# Patient Record
Sex: Female | Born: 1955
Health system: Southern US, Community
[De-identification: ages and names within clinical notes are randomized; demographics above are authoritative.]

## PROBLEM LIST (undated history)

## (undated) DIAGNOSIS — D649 Anemia, unspecified: Secondary | ICD-10-CM

## (undated) DIAGNOSIS — M1990 Unspecified osteoarthritis, unspecified site: Secondary | ICD-10-CM

## (undated) DIAGNOSIS — I10 Essential (primary) hypertension: Secondary | ICD-10-CM

## (undated) DIAGNOSIS — E119 Type 2 diabetes mellitus without complications: Secondary | ICD-10-CM

## (undated) DIAGNOSIS — T7840XA Allergy, unspecified, initial encounter: Secondary | ICD-10-CM

## (undated) DIAGNOSIS — H269 Unspecified cataract: Secondary | ICD-10-CM

## (undated) DIAGNOSIS — G709 Myoneural disorder, unspecified: Secondary | ICD-10-CM

## (undated) DIAGNOSIS — G4730 Sleep apnea, unspecified: Secondary | ICD-10-CM

## (undated) DIAGNOSIS — K219 Gastro-esophageal reflux disease without esophagitis: Secondary | ICD-10-CM

## (undated) DIAGNOSIS — G35 Multiple sclerosis: Secondary | ICD-10-CM

## (undated) HISTORY — PX: COLONOSCOPY: SHX174

## (undated) HISTORY — DX: Gastro-esophageal reflux disease without esophagitis: K21.9

## (undated) HISTORY — PX: CATARACT EXTRACTION, BILATERAL: SHX1313

## (undated) HISTORY — DX: Type 2 diabetes mellitus without complications: E11.9

## (undated) HISTORY — DX: Sleep apnea, unspecified: G47.30

## (undated) HISTORY — PX: OTHER SURGICAL HISTORY: SHX169

## (undated) HISTORY — DX: Allergy, unspecified, initial encounter: T78.40XA

## (undated) HISTORY — DX: Unspecified osteoarthritis, unspecified site: M19.90

## (undated) HISTORY — DX: Unspecified cataract: H26.9

## (undated) HISTORY — DX: Anemia, unspecified: D64.9

## (undated) HISTORY — DX: Myoneural disorder, unspecified: G70.9

---

## 1998-04-14 ENCOUNTER — Emergency Department (HOSPITAL_COMMUNITY): Admission: EM | Admit: 1998-04-14 | Discharge: 1998-04-14 | Payer: Self-pay | Admitting: Emergency Medicine

## 1999-07-02 ENCOUNTER — Emergency Department (HOSPITAL_COMMUNITY): Admission: EM | Admit: 1999-07-02 | Discharge: 1999-07-02 | Payer: Self-pay | Admitting: Emergency Medicine

## 1999-11-21 ENCOUNTER — Ambulatory Visit (HOSPITAL_COMMUNITY): Admission: RE | Admit: 1999-11-21 | Discharge: 1999-11-21 | Payer: Self-pay | Admitting: Obstetrics & Gynecology

## 1999-11-21 ENCOUNTER — Encounter: Payer: Self-pay | Admitting: Obstetrics & Gynecology

## 2002-06-12 ENCOUNTER — Ambulatory Visit (HOSPITAL_COMMUNITY): Admission: RE | Admit: 2002-06-12 | Discharge: 2002-06-12 | Payer: Self-pay | Admitting: Psychiatry

## 2002-06-12 ENCOUNTER — Encounter: Payer: Self-pay | Admitting: Psychiatry

## 2003-04-29 ENCOUNTER — Other Ambulatory Visit: Admission: RE | Admit: 2003-04-29 | Discharge: 2003-04-29 | Payer: Self-pay | Admitting: Obstetrics and Gynecology

## 2003-11-01 ENCOUNTER — Emergency Department (HOSPITAL_COMMUNITY): Admission: EM | Admit: 2003-11-01 | Discharge: 2003-11-01 | Payer: Self-pay | Admitting: *Deleted

## 2003-12-02 ENCOUNTER — Ambulatory Visit (HOSPITAL_COMMUNITY): Admission: RE | Admit: 2003-12-02 | Discharge: 2003-12-02 | Payer: Self-pay | Admitting: Internal Medicine

## 2003-12-02 ENCOUNTER — Encounter: Payer: Self-pay | Admitting: Cardiology

## 2004-12-23 ENCOUNTER — Emergency Department (HOSPITAL_COMMUNITY): Admission: EM | Admit: 2004-12-23 | Discharge: 2004-12-23 | Payer: Self-pay | Admitting: Emergency Medicine

## 2005-11-05 ENCOUNTER — Emergency Department (HOSPITAL_COMMUNITY): Admission: EM | Admit: 2005-11-05 | Discharge: 2005-11-05 | Payer: Self-pay | Admitting: *Deleted

## 2008-03-13 ENCOUNTER — Emergency Department (HOSPITAL_COMMUNITY): Admission: EM | Admit: 2008-03-13 | Discharge: 2008-03-13 | Payer: Self-pay | Admitting: Emergency Medicine

## 2009-02-14 ENCOUNTER — Emergency Department (HOSPITAL_COMMUNITY): Admission: EM | Admit: 2009-02-14 | Discharge: 2009-02-14 | Payer: Self-pay | Admitting: Emergency Medicine

## 2009-10-01 ENCOUNTER — Encounter: Admission: RE | Admit: 2009-10-01 | Discharge: 2009-10-01 | Payer: Self-pay | Admitting: Psychiatry

## 2009-11-01 ENCOUNTER — Encounter (INDEPENDENT_AMBULATORY_CARE_PROVIDER_SITE_OTHER): Payer: Self-pay

## 2009-11-02 ENCOUNTER — Telehealth: Payer: Self-pay | Admitting: Gastroenterology

## 2009-11-02 ENCOUNTER — Ambulatory Visit: Payer: Self-pay | Admitting: Gastroenterology

## 2009-11-02 ENCOUNTER — Encounter (INDEPENDENT_AMBULATORY_CARE_PROVIDER_SITE_OTHER): Payer: Self-pay | Admitting: *Deleted

## 2009-11-15 ENCOUNTER — Telehealth: Payer: Self-pay | Admitting: Gastroenterology

## 2009-11-16 ENCOUNTER — Ambulatory Visit: Payer: Self-pay | Admitting: Gastroenterology

## 2010-08-12 ENCOUNTER — Emergency Department (HOSPITAL_BASED_OUTPATIENT_CLINIC_OR_DEPARTMENT_OTHER): Admission: EM | Admit: 2010-08-12 | Discharge: 2010-08-12 | Payer: Self-pay | Admitting: Emergency Medicine

## 2010-10-12 NOTE — Procedures (Signed)
Summary: Colonoscopy  Patient: Kathryn Kathryn Gross Kathryn Gross Note: All result statuses are Final unless otherwise noted.  Tests: (1) Colonoscopy (COL)   COL Colonoscopy           DONE     [name]     [address]     [place], [place]  [address]           COLONOSCOPY PROCEDURE REPORT           PATIENT:  Kathryn Kathryn Gross, Kathryn Gross  MR#:  [id number]     BIRTHDATE:  05-20-1956, 53 yrs. old  GENDER:  female           ENDOSCOPIST:  Barbette Hair. Arlyce Dice, MD     Referred by:           PROCEDURE DATE:  11/16/2009     PROCEDURE:  Colonoscopy, Diagnostic     ASA CLASS:  Class II     INDICATIONS:  Routine Risk Screening           MEDICATIONS:   Fentanyl 50 mcg IV, Versed 7 mg IV           DESCRIPTION OF PROCEDURE:   After the risks benefits and     alternatives of the procedure were thoroughly explained, informed     consent was obtained.  Digital rectal exam was performed and     revealed no abnormalities.   The LB [id number] endoscope     was introduced through the anus and advanced to the cecum, which     was identified by both the appendix and ileocecal valve, without     limitations.  The quality of the prep was good, using MoviPrep.     The instrument was then slowly withdrawn as the colon was fully     examined.     <<PROCEDUREIMAGES>>           FINDINGS:  Mild diverticulosis was found in the sigmoid colon (see     image14).  This was otherwise a normal examination of the colon     (see image2, image4, image5, image6, image7, image9, image10,     image12, image13, image18, and image19).   Retroflexed views in     the rectum revealed no abnormalities.    The scope was then     withdrawn from the patient and the procedure completed.           COMPLICATIONS:  None           ENDOSCOPIC IMPRESSION:     1) Mild diverticulosis in the sigmoid colon     2) Otherwise normal examination     RECOMMENDATIONS:     1) Continue current colorectal screening recommendations for     "routine  risk" patients with a repeat colonoscopy in 10 years.           REPEAT EXAM:  In 10 year(s) for Colonoscopy.           ______________________________     Barbette Hair. Arlyce Dice, MD           CC:  Tracey Harries, MD           n.     Rosalie Doctor:   Barbette Hair. Kaplan at 11/16/2009 10:07 AM           Melinda Crutch, [id number]  Note: An exclamation mark (!) indicates a result that was not dispersed into the flowsheet. Document Creation Date: 11/16/2009 10:07 AM _______________________________________________________________________  (1) Order result status: Final Collection  or observation date-time: 11/16/2009 10:00 Requested date-time:  Receipt date-time:  Reported date-time:  Referring Physician:   Ordering Physician: Melvia Heaps [phone]) Specimen Source:  Source: Launa Grill Order Number: [id number] Lab site:   Appended Document: Colonoscopy    Clinical Lists Changes  Observations: Added new observation of COLONNXTDUE: 11/2019 (11/16/2009 13:28)

## 2010-10-12 NOTE — Progress Notes (Signed)
Summary: prep ?'s  Phone Note Call from Patient Call back at Home Phone [phone]   Caller: Patient Call For: Dr. Arlyce Dice Reason for Call: Talk to Nurse Summary of Call: pt has questions regarding her prep... procedure is tomorrow Initial call taken by: Vallarie Mare,  November 15, 2009 10:12 AM    Additional Follow-up for Phone Call Additional follow up Details #2::    pt. wanted to clarify when to start prep solution explained to take two ducolax at 3p.m. and start drinking solution at 6p.m. and take two more ducolax at 8p.m. and informed her that she is to be on clear liquid diet all day, but she can drink coffee without dairy products. Follow-up by: Greer Ee RN,  November 15, 2009 10:22 AM

## 2010-10-12 NOTE — Progress Notes (Signed)
Summary: Ins. will not pay for Moviprep  Phone Note From Pharmacy   Caller: [facility]  [phone] Call For: Dr. Arlyce Dice  Reason for Call: Medication not on formulary Summary of Call: Pt.'s ins. will not pay for the Moviprep. Initial call taken by: Karna Christmas,  November 02, 2009 3:19 PM    New/Updated Medications: DULCOLAX 5 MG  TBEC (BISACODYL) Day before procedure take 2 at 3pm and 2 at 8pm. METOCLOPRAMIDE HCL 10 MG  TABS (METOCLOPRAMIDE HCL) As per prep instructions. MIRALAX   POWD (POLYETHYLENE GLYCOL 3350) As per prep  instructions. Prescriptions: MIRALAX   POWD (POLYETHYLENE GLYCOL 3350) As per prep  instructions.  #255gm x 0   Entered by:   Wyona Almas RN   Authorized by:   Louis Meckel MD   Signed by:   Wyona Almas RN on 11/02/2009   Method used:   Electronically to        [facility] [phone]* (retail)       [address]       [place], [place]  [address]       Ph: [phone]       Fax: [phone]   RxID:   [id number] METOCLOPRAMIDE HCL 10 MG  TABS (METOCLOPRAMIDE HCL) As per prep instructions.  #2 x 0   Entered by:   Wyona Almas RN   Authorized by:   Louis Meckel MD   Signed by:   Wyona Almas RN on 11/02/2009   Method used:   Electronically to        [facility] [phone]* (retail)       [address]       [place], [place]  [address]       Ph: [phone]       Fax: [phone]   RxID:   [id number] DULCOLAX 5 MG  TBEC (BISACODYL) Day before procedure take 2 at 3pm and 2 at 8pm.  #4 x 0   Entered by:   Wyona Almas RN   Authorized by:   Louis Meckel MD   Signed by:   Wyona Almas RN on 11/02/2009   Method used:   Electronically to        [facility] [phone]* (retail)       [address]       [place], [place]  [address]       Ph: [phone]       Fax: [phone]   RxID:   [id number]

## 2010-10-12 NOTE — Letter (Signed)
Summary: [facility] Instructions  [facility]  [address] [place], [place] [address]   Phone: [phone]  Fax: [phone]       Kathryn Gross    02-09-1956    MRN: [id number]        Procedure Day Dorna Bloom: Wednesday 11/16/2009     Arrival Time: 8:00 am      Procedure Time: 9:00 am     Location of Procedure:                    _ x_  [facility] (4th Floor)                        PREPARATION FOR COLONOSCOPY WITH MOVIPREP   Starting 5 days prior to your procedure Friday 3/4  do not eat nuts, seeds, popcorn, corn, beans, peas,  salads, or any raw vegetables.  Do not take any fiber supplements (e.g. Metamucil, Citrucel, and Benefiber).  THE DAY BEFORE YOUR PROCEDURE         DATE: Tuesday 3/8  1.  Drink clear liquids the entire day-NO SOLID FOOD  2.  Do not drink anything colored red or purple.  Avoid juices with pulp.  No orange juice.  3.  Drink at least 64 oz. (8 glasses) of fluid/clear liquids during the day to prevent dehydration and help the prep work efficiently.  CLEAR LIQUIDS INCLUDE: Water Jello Ice Popsicles Tea (sugar ok, no milk/cream) Powdered fruit flavored drinks Coffee (sugar ok, no milk/cream) Gatorade Juice: apple, white grape, white cranberry  Lemonade Clear bullion, consomm, broth Carbonated beverages (any kind) Strained chicken noodle soup Hard Candy                             4.  In the morning, mix first dose of MoviPrep solution:    Empty 1 Pouch A and 1 Pouch B into the disposable container    Add lukewarm drinking water to the top line of the container. Mix to dissolve    Refrigerate (mixed solution should be used within 24 hrs)  5.  Begin drinking the prep at 5:00 p.m. The MoviPrep container is divided by 4 marks.   Every 15 minutes drink the solution down to the next mark (approximately 8 oz) until the full liter is complete.   6.  Follow completed prep with 16 oz of clear liquid of your choice  (Nothing red or purple).  Continue to drink clear liquids until bedtime.  7.  Before going to bed, mix second dose of MoviPrep solution:    Empty 1 Pouch A and 1 Pouch B into the disposable container    Add lukewarm drinking water to the top line of the container. Mix to dissolve    Refrigerate  THE DAY OF YOUR PROCEDURE      DATE: Wednesday 3/9  Beginning at 4:00 am (5 hours before procedure):         1. Every 15 minutes, drink the solution down to the next mark (approx 8 oz) until the full liter is complete.  2. Follow completed prep with 16 oz. of clear liquid of your choice.    3. You may drink clear liquids until 7:00 am  (2 HOURS BEFORE PROCEDURE).   MEDICATION INSTRUCTIONS  Unless otherwise instructed, you should take regular prescription medications with a small sip of water   as early as possible the morning  of your procedure.          OTHER INSTRUCTIONS  You will need a responsible adult at least 53 years of age to accompany you and drive you home.   This person must remain in the waiting room during your procedure.  Wear loose fitting clothing that is easily removed.  Leave jewelry and other valuables at home.  However, you may wish to bring a book to read or  an iPod/MP3 player to listen to music as you wait for your procedure to start.  Remove all body piercing jewelry and leave at home.  Total time from sign-in until discharge is approximately 2-3 hours.  You should go home directly after your procedure and rest.  You can resume normal activities the  day after your procedure.  The day of your procedure you should not:   Drive   Make legal decisions   Operate machinery   Drink alcohol   Return to work  You will receive specific instructions about eating, activities and medications before you leave.    The above instructions have been reviewed and explained to me by   Ulis Rias RN  November 02, 2009 9:11 AM     I fully understand  and can verbalize these instructions _____________________________ Date _________

## 2010-10-12 NOTE — Letter (Signed)
Summary: [facility] Instructions  [facility]  [address] [place], [place] [address]   Phone: [phone]  Fax: [phone]       Kathryn Gross    March 28, 1956    MRN: [id number]       Procedure Day Dorna Bloom:  [facility]  11/16/09     Arrival Time: 8:00AM     Procedure Time:  9:00AM     Location of Procedure:                    _ X_  [facility] (4th Floor)    PREPARATION FOR COLONOSCOPY WITH MIRALAX  Starting 5 days prior to your procedure 11/11/09 do not eat nuts, seeds, popcorn, corn, beans, peas,  salads, or any raw vegetables.  Do not take any fiber supplements (e.g. Metamucil, Citrucel, and Benefiber). ____________________________________________________________________________________________________   THE DAY BEFORE YOUR PROCEDURE         DATE: 11/15/09  DAY: TUESDAY  1   Drink clear liquids the entire day-NO SOLID FOOD  2   Do not drink anything colored red or purple.  Avoid juices with pulp.  No orange juice.  3   Drink at least 64 oz. (8 glasses) of fluid/clear liquids during the day to prevent dehydration and help the prep work efficiently.  CLEAR LIQUIDS INCLUDE: Water Jello Ice Popsicles Tea (sugar ok, no milk/cream) Powdered fruit flavored drinks Coffee (sugar ok, no milk/cream) Gatorade Juice: apple, white grape, white cranberry  Lemonade Clear bullion, consomm, broth Carbonated beverages (any kind) Strained chicken noodle soup Hard Candy  4   Mix the entire bottle of Miralax with 64 oz. of Gatorade/Powerade in the morning and put in the refrigerator to chill.  5   At 3:00 pm take 2 Dulcolax/Bisacodyl tablets.  6   At 4:30 pm take one Reglan/Metoclopramide tablet.  7  Starting at 5:00 pm drink one 8 oz glass of the Miralax mixture every 15-20 minutes until you have finished drinking the entire 64 oz.  You should finish drinking prep around 7:30 or 8:00 pm.  8   If you are nauseated, you may take the 2nd Reglan/Metoclopramide  tablet at 6:30 pm.        9    At 8:00 pm take 2 more DULCOLAX/Bisacodyl tablets.     THE DAY OF YOUR PROCEDURE      DATE:  11/16/09  DAY:  Lulu Riding  You may drink clear liquids until 7:00AM (2 HOURS BEFORE PROCEDURE).   MEDICATION INSTRUCTIONS  Unless otherwise instructed, you should take regular prescription medications with a small sip of water as early as possible the morning of your procedure.         OTHER INSTRUCTIONS  You will need a responsible adult at least 53 years of age to accompany you and drive you home.   This person must remain in the waiting room during your procedure.  Wear loose fitting clothing that is easily removed.  Leave jewelry and other valuables at home.  However, you may wish to bring a book to read or an iPod/MP3 player to listen to music as you wait for your procedure to start.  Remove all body piercing jewelry and leave at home.  Total time from sign-in until discharge is approximately 2-3 hours.  You should go home directly after your procedure and rest.  You can resume normal activities the day after your procedure.  The day of your procedure you should not:   Drive  Make legal decisions   Operate machinery   Drink alcohol   Return to work  You will receive specific instructions about eating, activities and medications before you leave.   The above instructions have been reviewed and explained to me by   Wyona Almas RN  November 02, 2009 3:44 PM     I fully understand and can verbalize these instructions _____________________________ Date _______

## 2010-10-12 NOTE — Miscellaneous (Signed)
Summary: Lec previsit  Clinical Lists Changes  Medications: Added new medication of MOVIPREP 100 GM  SOLR (PEG-KCL-NACL-NASULF-NA ASC-C) As per prep instructions. - Signed Rx of MOVIPREP 100 GM  SOLR (PEG-KCL-NACL-NASULF-NA ASC-C) As per prep instructions.;  #1 x 0;  Signed;  Entered by: Ulis Rias RN;  Authorized by: Louis Meckel MD;  Method used: Electronically to [facility] [phone]*, [address] [place], [place]  [address], Ph: [phone], Fax: [phone] Observations: Added new observation of NKA: T (11/02/2009 8:53)    Prescriptions: MOVIPREP 100 GM  SOLR (PEG-KCL-NACL-NASULF-NA ASC-C) As per prep instructions.  #1 x 0   Entered by:   Ulis Rias RN   Authorized by:   Louis Meckel MD   Signed by:   Ulis Rias RN on 11/02/2009   Method used:   Electronically to        [facility] [phone]* (retail)       [address]       [place], [place]  [address]       Ph: [phone]       Fax: [phone]   RxID:   [id number]

## 2010-10-31 ENCOUNTER — Other Ambulatory Visit: Payer: Self-pay | Admitting: Psychiatry

## 2010-10-31 DIAGNOSIS — G35 Multiple sclerosis: Secondary | ICD-10-CM

## 2010-11-06 ENCOUNTER — Ambulatory Visit
Admission: RE | Admit: 2010-11-06 | Discharge: 2010-11-06 | Disposition: A | Discharge status: Home / Self Care | Payer: Medicare Other | Source: Ambulatory Visit | Attending: Psychiatry | Admitting: Psychiatry

## 2010-11-06 DIAGNOSIS — G35 Multiple sclerosis: Secondary | ICD-10-CM

## 2010-11-06 MED ORDER — GADOBENATE DIMEGLUMINE 529 MG/ML IV SOLN
20.0000 mL | Freq: Once | INTRAVENOUS | Status: AC | PRN
  Administered 2010-11-06: 20 mL via INTRAVENOUS

## 2010-11-21 LAB — RAPID STREP SCREEN (MED CTR MEBANE ONLY): Streptococcus, Group A Screen (Direct): NEGATIVE

## 2010-12-18 LAB — DIFFERENTIAL
Basophils Absolute: 0 10*3/uL (ref 0.0–0.1)
Lymphocytes Relative: 32 % (ref 12–46)
Lymphs Abs: 3.1 10*3/uL (ref 0.7–4.0)
Neutro Abs: 5.8 10*3/uL (ref 1.7–7.7)
Neutrophils Relative %: 60 % (ref 43–77)

## 2010-12-18 LAB — BASIC METABOLIC PANEL
BUN: 12 mg/dL (ref 6–23)
Calcium: 9.2 mg/dL (ref 8.4–10.5)
GFR calc non Af Amer: >60 mL/min (ref >60)
Glucose, Bld: 121 mg/dL — ABNORMAL HIGH (ref 70–99)
Potassium: 3.7 mEq/L (ref 3.5–5.1)

## 2010-12-18 LAB — POCT CARDIAC MARKERS
CKMB, poc: 1.8 ng/mL (ref 1.0–8.0)
Myoglobin, poc: 62.3 ng/mL (ref 12–200)
Troponin i, poc: <0.05 ng/mL (ref 0.00–0.09)

## 2010-12-18 LAB — CBC
HCT: 36.3 % (ref 36.0–46.0)
Platelets: 251 10*3/uL (ref 150–400)
RDW: 14.8 % (ref 11.5–15.5)
WBC: 9.7 10*3/uL (ref 4.0–10.5)

## 2011-02-18 ENCOUNTER — Inpatient Hospital Stay (HOSPITAL_COMMUNITY)
Admission: AD | Admit: 2011-02-18 | Discharge: 2011-02-21 | DRG: 690 | Disposition: A | Discharge status: Home / Self Care | Payer: Medicare Other | Source: Other Acute Inpatient Hospital | Attending: Internal Medicine | Admitting: Internal Medicine

## 2011-02-18 ENCOUNTER — Emergency Department (HOSPITAL_BASED_OUTPATIENT_CLINIC_OR_DEPARTMENT_OTHER)
Admission: EM | Admit: 2011-02-18 | Discharge: 2011-02-18 | Disposition: A | Discharge status: Home / Self Care | Payer: Medicare Other | Source: Home / Self Care | Attending: Emergency Medicine | Admitting: Emergency Medicine

## 2011-02-18 DIAGNOSIS — E876 Hypokalemia: Secondary | ICD-10-CM | POA: Diagnosis present

## 2011-02-18 DIAGNOSIS — I10 Essential (primary) hypertension: Secondary | ICD-10-CM | POA: Diagnosis present

## 2011-02-18 DIAGNOSIS — G35 Multiple sclerosis: Secondary | ICD-10-CM | POA: Diagnosis present

## 2011-02-18 DIAGNOSIS — N10 Acute tubulo-interstitial nephritis: Principal | ICD-10-CM | POA: Diagnosis present

## 2011-02-18 LAB — COMPREHENSIVE METABOLIC PANEL
AST: 12 U/L (ref 0–37)
Albumin: 3.5 g/dL (ref 3.5–5.2)
Alkaline Phosphatase: 69 U/L (ref 39–117)
BUN: 15 mg/dL (ref 6–23)
Chloride: 105 mEq/L (ref 96–112)
Creatinine, Ser: 0.6 mg/dL (ref 0.4–1.2)
Potassium: 2.8 mEq/L — ABNORMAL LOW (ref 3.5–5.1)
Total Protein: 6.5 g/dL (ref 6.0–8.3)

## 2011-02-18 LAB — CBC
HCT: 35.2 % — ABNORMAL LOW (ref 36.0–46.0)
MCHC: 33.5 g/dL (ref 30.0–36.0)
Platelets: 248 10*3/uL (ref 150–400)
RDW: 14.9 % (ref 11.5–15.5)
WBC: 20.9 10*3/uL — ABNORMAL HIGH (ref 4.0–10.5)

## 2011-02-18 LAB — URINE MICROSCOPIC-ADD ON

## 2011-02-18 LAB — URINALYSIS, ROUTINE W REFLEX MICROSCOPIC
Glucose, UA: NEGATIVE mg/dL
Nitrite: NEGATIVE
Specific Gravity, Urine: 1.027 (ref 1.005–1.030)
pH: 6 (ref 5.0–8.0)

## 2011-02-18 LAB — DIFFERENTIAL
Basophils Relative: 0 % (ref 0–1)
Eosinophils Absolute: 0 10*3/uL (ref 0.0–0.7)
Eosinophils Relative: 0 % (ref 0–5)
Lymphs Abs: 6.7 10*3/uL — ABNORMAL HIGH (ref 0.7–4.0)
Neutrophils Relative %: 62 % (ref 43–77)

## 2011-02-18 LAB — PREGNANCY, URINE: Preg Test, Ur: NEGATIVE

## 2011-02-19 ENCOUNTER — Inpatient Hospital Stay (HOSPITAL_COMMUNITY): Payer: Medicare Other

## 2011-02-19 LAB — BASIC METABOLIC PANEL
BUN: 12 mg/dL (ref 6–23)
CO2: 25 mEq/L (ref 19–32)
Calcium: 8.1 mg/dL — ABNORMAL LOW (ref 8.4–10.5)
Creatinine, Ser: 0.57 mg/dL (ref 0.4–1.2)
Glucose, Bld: 164 mg/dL — ABNORMAL HIGH (ref 70–99)

## 2011-02-19 LAB — CBC
HCT: 35.6 % — ABNORMAL LOW (ref 36.0–46.0)
Hemoglobin: 12.3 g/dL (ref 12.0–15.0)
MCH: 30.8 pg (ref 26.0–34.0)
MCV: 89.2 fL (ref 78.0–100.0)
RBC: 3.99 MIL/uL (ref 3.87–5.11)

## 2011-02-20 LAB — CBC
MCH: 29.3 pg (ref 26.0–34.0)
Platelets: 230 10*3/uL (ref 150–400)
RBC: 4.13 MIL/uL (ref 3.87–5.11)

## 2011-02-20 LAB — URINALYSIS, ROUTINE W REFLEX MICROSCOPIC
Ketones, ur: NEGATIVE mg/dL
Leukocytes, UA: NEGATIVE
Nitrite: NEGATIVE
Protein, ur: NEGATIVE mg/dL
Urobilinogen, UA: 0.2 mg/dL (ref 0.0–1.0)
pH: 6.5 (ref 5.0–8.0)

## 2011-02-20 LAB — BASIC METABOLIC PANEL
CO2: 26 mEq/L (ref 19–32)
Calcium: 8.5 mg/dL (ref 8.4–10.5)
GFR calc non Af Amer: >60 mL/min (ref >60)
Sodium: 139 mEq/L (ref 135–145)

## 2011-02-20 LAB — URINE CULTURE: Colony Count: 50000

## 2011-02-20 LAB — MAGNESIUM: Magnesium: 2.2 mg/dL (ref 1.5–2.5)

## 2011-02-21 LAB — BASIC METABOLIC PANEL
BUN: 11 mg/dL (ref 6–23)
Calcium: 8.6 mg/dL (ref 8.4–10.5)
GFR calc non Af Amer: >60 mL/min (ref >60)
Glucose, Bld: 168 mg/dL — ABNORMAL HIGH (ref 70–99)

## 2011-02-21 LAB — CBC
HCT: 39.8 % (ref 36.0–46.0)
Hemoglobin: 13.1 g/dL (ref 12.0–15.0)
MCH: 29.7 pg (ref 26.0–34.0)
MCHC: 32.9 g/dL (ref 30.0–36.0)
MCV: 90.2 fL (ref 78.0–100.0)

## 2011-02-21 LAB — URINE CULTURE

## 2011-03-29 NOTE — Discharge Summary (Signed)
NAMEMarland Kitchen  Kathryn Gross, Kathryn Gross NO.:  [id number]  MEDICAL RECORD NO.:  [id number]  LOCATION:  5523                         FACILITY:  [facility]  PHYSICIAN:  Calvert Cantor, M.D.     DATE OF BIRTH:  1955/12/24  DATE OF ADMISSION:  02/18/2011 DATE OF DISCHARGE:  02/21/2011                              DISCHARGE SUMMARY   PRIMARY CARE PHYSICIAN:  Tracey Harries, MD, who is with [facility].  NEUROLOGIST:  Dr. Harriette Bouillon in [place].  CONDITION AT THE TIME OF DISCHARGE:  The patient is up and chipper requesting discharge to home.  She is still having mild nausea and mild back pain, but appears to be recovering well from her pyelonephritis.  DISCHARGE DIAGNOSES: 1. Acute pyelonephritis. 2. Hypokalemia. 3. Hypertension. 4. History of multiple sclerosis with no acute issues at this point.  HISTORY OF PRESENT ILLNESS AND BRIEF HOSPITAL COURSE:  Kathryn Gross is a 55 year old female with a history of multiple sclerosis and hypertension who [facility] 1 week of feeling weak, with difficulty walking secondary to back pain for approximately 2 weeks.  She stated that back pain was across her lower back and describes urinary frequency.  She had been to see her doctor, a few days earlier and was placed on a prednisone taper, however, this not had been helping with her symptoms.  In the emergency department, she was found to have urinary tract infection and she was admitted on IV antibiotics. 1. Pyelonephritis.  The patient had a renal ultrasound done on February 19, 2011, that showed mildly elevated renal cortical echogenicity,     but did not show any obstruction or mass.  She improved steadily on     IV antibiotics, primarily Cipro 500 mg q.8 hours.  For pain, she     She required Mobic, oxycodone, when the oxycodone was discontinued,     she utilized tramadol.  Her original urinalysis in the  emergency     department showed only small leukocytes with 7-10 white blood     cells.  Urine culture was found to have insignificant growth and     was finalized on February 21, 2011.  The patient will be discharged to     home with 6 more days of Cipro oral antibiotics. 2. Hypokalemia.  On admission, the patient's potassium was 2.8, this     was repleted.  She was found to have a magnesium level of 2.5.     After repletion, her potassium level remained stable today at the     time of discharge, it was 3.6. 3. Hypertension.  The patient's blood pressures in-house were within     reasonable range with a systolic ranging from 110-136 and diastolic     ranging from 67-84.  She was maintained on her lisinopril in-house. 4. Multiple sclerosis.  There were no acute issues with this     diagnosis.  At present, her prednisone taper was discontinued on     admission.  DISCHARGE PHYSICAL EXAMINATION:  GENERAL:  Physical exam at time of discharge, patient is alert and oriented.  She is dressed in her street  clothes and walking about the room without difficulty. VITAL SIGNS:  Temperature 97.9, pulse 63, respirations 20, blood pressure 110/67. HEENT:  Head is atraumatic, normocephalic.  Eyes are anicteric.  Her pupils are equal and round.  Nose shows no nasal discharge or exterior lesions.  Mouth has moist mucous membranes. NECK:  Supple with midline trachea.  No JVD.  No lymphadenopathy. CHEST:  Demonstrates no accessory muscle use.  She has no wheezes or crackles to my exam. HEART:  Regular rate and rhythm without obvious murmurs, rubs, or gallops. ABDOMEN:  Soft, obese, nontender, nondistended with bowel sounds. EXTREMITIES:  Show no clubbing, cyanosis, or edema. PSYCHIATRIC:  Patient is alert and oriented.  Demeanor is pleasant, cooperative.  Grooming is excellent.  DISCHARGE MEDICATIONS: 1. Ciprofloxacin 500 mg 1 tablet twice daily take for 6 days ending     the evening of February 28, 2011.   She received a prescription for 13     tablets. 2. Phenergan 25 mg 1 tablet by mouth every 6 hours as needed for     nausea. 3. Tramadol 50 mg 1 tablet by mouth every 6 hours as needed for pain.     She will be given a prescription for 5 days. 4. Lisinopril 10 mg 1 tablet by mouth daily. 5. Meloxicam 15 mg 1 tablet by mouth daily p.r.n. pain. 6. Methylphenidate 10 mg 1 tablet by mouth daily as needed for MS     flare-ups. 7. Rebif interferon beta- 1a 44 mcg injection one subcutaneously every     other day. 8. Temazepam 15 mg for sleep 1 capsule by mouth daily at bedtime as     needed.  DISCHARGE INSTRUCTIONS:  The patient is to increase activity slowly. She is to remain on a low-sodium heart-healthy diet.  She is to call Dr. Tracey Harries to have a hospital followup appointment within 2 weeks.     Stephani Police, PA   ______________________________ Calvert Cantor, M.D.    MLY/MEDQ  D:  02/21/2011  T:  02/22/2011  Job:  [id number]  cc:   Tracey Harries, M.D. Dr. Harriette Bouillon  Electronically Signed by Algis Downs PA on 03/05/2011 07:43:53 PM Electronically Signed by Calvert Cantor M.D. on 03/29/2011 12:10:05 PM

## 2011-04-02 ENCOUNTER — Ambulatory Visit: Payer: Medicare Other | Admitting: Rehabilitation

## 2011-04-04 ENCOUNTER — Ambulatory Visit: Payer: Medicare Other | Attending: Family Medicine | Admitting: Rehabilitation

## 2011-04-04 DIAGNOSIS — M545 Low back pain, unspecified: Secondary | ICD-10-CM | POA: Insufficient documentation

## 2011-04-04 DIAGNOSIS — M2569 Stiffness of other specified joint, not elsewhere classified: Secondary | ICD-10-CM | POA: Insufficient documentation

## 2011-04-04 DIAGNOSIS — IMO0001 Reserved for inherently not codable concepts without codable children: Secondary | ICD-10-CM | POA: Insufficient documentation

## 2011-04-10 ENCOUNTER — Encounter: Payer: Medicare Other | Admitting: Physical Therapy

## 2011-04-13 ENCOUNTER — Encounter: Payer: Medicare Other | Admitting: Physical Therapy

## 2011-06-26 NOTE — H&P (Signed)
Kathryn Gross, Kathryn Gross NO.:  [id number]  MEDICAL RECORD NO.:  [id number]  LOCATION:  5523                         FACILITY:  [facility]  PHYSICIAN:  Lamar Laundry, MD      DATE OF BIRTH:  [birth year]  DATE OF ADMISSION:  02/18/2011 DATE OF DISCHARGE:                             HISTORY & PHYSICAL   CHIEF COMPLAINT:  Back pain and weakness.  HISTORY OF PRESENT ILLNESS:  Ms. Leventhal is a 55 year old female with a history of multiple sclerosis and hypertension who presented to the [facility] ER today after 1 week of being weak, feeling like it was more difficult for her to walk and back pain for 2 weeks.  She states the back pain was located across her lower back.  She also described some increase in urinary frequency.  She denies any dysuria.  She denies any fevers.  Of note the patient thought that her symptoms may have been secondary to multiple sclerosis, so she spoke with her outpatient doctor and was subsequently placed on a prednisone taper 6 days ago.  Normally when she has been placed on prednisone, this has relieved her symptoms that she did not get better at all.  At the outside ER today, they realized that she had a urinary tract infection and started her on ceftriaxone.  She also received Zofran.  ALLERGIES:  No known drug allergies.  MEDICATIONS: 1. Lisinopril 10 mg p.o. daily. 2. Prednisone taper. 3. Cystex. 4. Meloxicam 15 mg p.o. daily. 5. Methylphenidate p.r.n. multiple sclerosis flare. 6. Temazepam p.r.n.  PAST MEDICAL HISTORY:  Significant for multiple sclerosis, hypertension.  SOCIAL HISTORY:  She lives in [place].  She is married.  She has 3 kids.  She is on disability for multiple sclerosis.  She denies use of tobacco, alcohol, or drugs.  FAMILY HISTORY:  Her father has diabetes mellitus.  REVIEW OF SYSTEMS:  Review of 10 systems was performed and is negative with exception of the complaints previously detailed in the  HPI.  PHYSICAL EXAMINATION:  VITAL SIGNS:  Temperature 97.9, blood pressure 151/70, pulse in the 60s, respirations 18. GENERAL:  This is a well-appearing African American female in no acute distress, awake, alert and oriented x3. HEENT:  Normocephalic, atraumatic.  Extraocular movements intact.  Moist mucous membranes. NECK:  No jugular venous distention. CARDIOVASCULAR:  Regular rate and rhythm.  S1, S2.  No murmurs, rubs, or gallops. LUNGS:  Clear to auscultation bilaterally.  No crackles, wheezes, or rhonchi. ABDOMEN:  Soft, nontender, nondistended.  Normoactive bowel sounds. EXTREMITIES:  No pretibial edema. NEUROLOGIC:  Awake, alert, oriented x3.  No focal motor sensory deficits. MUSCULOSKELETAL:  The patient does not have any focal tenderness currently across her lower back up, although she does describe having lower back tenderness earlier in the day.  She believes that antibiotics and fluids have helped her. PSYCH:  Appropriate mood and affect.  LABORATORY DATA:  WBC 20.9 with 62% neutrophils, hemoglobin 11.8, hematocrit 35.2, platelets 248,000.  Sodium 142, potassium 2.8, chloride 105, bicarb 25, BUN 15, creatinine 0.6, glucose 96.  LFTs within normal limits.  Urinalysis reveals many bacteria, 7-10 WBCs per high-power field, small leukocyte esterase.  Urine pregnancy test was negative.  ASSESSMENT/PLAN:  This is a 55 year old female with multiple sclerosis, hypertension with pyelonephritis and hypokalemia. 1. Pyelonephritis.  The patient had lower back pain for a week,     urinary frequency, and has a positive urinalysis.  She received     ceftriaxone earlier today and we will continue this and await urine     culture results.  Additionally, as she did have such a long     duration of back pain and to avoid test that would expose woman in     early 66s to do radiation, we will check a renal ultrasound in case     there is some complicating anatomic factor due to her long  duration     of back pain.  We will continue her IV fluids. 2. Hypokalemia.  It is interesting that she is so hypokalemic,     especially seeing she is on an ACE inhibitor.  We will check her     for renal tubular acidosis.  We will replete her potassium and     check a magnesium level.  Unfortunately, she had been given     magnesium previously at the outside hospital for magnesium level     that was drawn. 3. Hypertension.  We will continue her outpatient lisinopril. 4. Multiple sclerosis.  There are no acute issues with this at     present.  We will discontinue her prednisone taper as it is likely     infection rather than multiple sclerosis flare was the cause of her     symptoms.     Lamar Laundry, MD     HR/MEDQ  D:  02/18/2011  T:  02/18/2011  Job:  [id number]  Electronically Signed by Virginia Rochester M.D. on 06/26/2011 02:18:17 PM

## 2012-08-18 ENCOUNTER — Encounter (HOSPITAL_BASED_OUTPATIENT_CLINIC_OR_DEPARTMENT_OTHER): Payer: Self-pay | Admitting: *Deleted

## 2012-08-18 ENCOUNTER — Emergency Department (HOSPITAL_BASED_OUTPATIENT_CLINIC_OR_DEPARTMENT_OTHER): Payer: Medicare Other

## 2012-08-18 ENCOUNTER — Emergency Department (HOSPITAL_BASED_OUTPATIENT_CLINIC_OR_DEPARTMENT_OTHER)
Admission: EM | Admit: 2012-08-18 | Discharge: 2012-08-18 | Disposition: A | Discharge status: Home / Self Care | Payer: Medicare Other | Attending: Emergency Medicine | Admitting: Emergency Medicine

## 2012-08-18 DIAGNOSIS — I10 Essential (primary) hypertension: Secondary | ICD-10-CM | POA: Insufficient documentation

## 2012-08-18 DIAGNOSIS — G35 Multiple sclerosis: Secondary | ICD-10-CM | POA: Insufficient documentation

## 2012-08-18 DIAGNOSIS — R61 Generalized hyperhidrosis: Secondary | ICD-10-CM | POA: Insufficient documentation

## 2012-08-18 DIAGNOSIS — R079 Chest pain, unspecified: Secondary | ICD-10-CM | POA: Insufficient documentation

## 2012-08-18 DIAGNOSIS — Z79899 Other long term (current) drug therapy: Secondary | ICD-10-CM | POA: Insufficient documentation

## 2012-08-18 DIAGNOSIS — R42 Dizziness and giddiness: Secondary | ICD-10-CM | POA: Insufficient documentation

## 2012-08-18 HISTORY — DX: Essential (primary) hypertension: I10

## 2012-08-18 HISTORY — DX: Multiple sclerosis: G35

## 2012-08-18 LAB — COMPREHENSIVE METABOLIC PANEL
ALT: 20 U/L (ref 0–35)
Albumin: 3.9 g/dL (ref 3.5–5.2)
Alkaline Phosphatase: 85 U/L (ref 39–117)
BUN: 11 mg/dL (ref 6–23)
Chloride: 103 mEq/L (ref 96–112)
Potassium: 3.9 mEq/L (ref 3.5–5.1)
Sodium: 138 mEq/L (ref 135–145)
Total Bilirubin: 0.2 mg/dL — ABNORMAL LOW (ref 0.3–1.2)

## 2012-08-18 LAB — CBC WITH DIFFERENTIAL/PLATELET
Basophils Relative: 0 % (ref 0–1)
Hemoglobin: 11.9 g/dL — ABNORMAL LOW (ref 12.0–15.0)
Lymphs Abs: 4.2 10*3/uL — ABNORMAL HIGH (ref 0.7–4.0)
MCHC: 33.6 g/dL (ref 30.0–36.0)
Monocytes Relative: 6 % (ref 3–12)
Neutro Abs: 6 10*3/uL (ref 1.7–7.7)
Neutrophils Relative %: 54 % (ref 43–77)
Platelets: 299 10*3/uL (ref 150–400)
RBC: 4.13 MIL/uL (ref 3.87–5.11)

## 2012-08-18 LAB — TROPONIN I: Troponin I: <0.3 ng/mL (ref <0.30)

## 2012-08-18 MED ORDER — KETOROLAC TROMETHAMINE 30 MG/ML IJ SOLN
30.0000 mg | Freq: Once | INTRAMUSCULAR | Status: AC
  Administered 2012-08-18: 30 mg via INTRAVENOUS
  Filled 2012-08-18: qty 1

## 2012-08-18 MED ORDER — ASPIRIN 81 MG PO CHEW
324.0000 mg | CHEWABLE_TABLET | Freq: Once | ORAL | Status: AC
  Administered 2012-08-18: 324 mg via ORAL
  Filled 2012-08-18: qty 4

## 2012-08-18 NOTE — ED Provider Notes (Signed)
History  This chart was scribed for Kathryn B. Bernette Mayers, MD by Ardeen Jourdain, ED Scribe. This patient was seen in room MH12/MH12 and the patient's care was started at 1951.  CSN: [id number]  Arrival date & time 08/18/12  1919   First MD Initiated Contact with Patient 08/18/12 1951      Chief Complaint  Patient presents with  . Chest Pain     The history is provided by the patient. No language interpreter was used.    Kathryn Gross is a 56 y.o. female who presents to the Emergency Department complaining of gradually worsening, sudden onset, intermittent CP with associated light headedness. She denies nausea and diaphoresis as associated symptoms. She states the pain started this afternoon and has started radiating into her left arm and left leg. She describes the CP as a sharp piercing feeling and the arm/leg pain as throbbing. She reports thinking the pain was gas earlier but found no relief from ginger ale. She has a h/o MS and her movement is not restricted by the disease.   Past Medical History  Diagnosis Date  . Hypertension   . Multiple sclerosis     Past Surgical History  Procedure Date  . C sections     No family history on file.  History  Substance Use Topics  . Smoking status: Never Smoker   . Smokeless tobacco: Not on file  . Alcohol Use: No   No OB history available.   Review of Systems  All other systems reviewed and are negative.   A complete 10 system review of systems was obtained and all systems are negative except as noted in the HPI and PMH.    Allergies  Review of patient's allergies indicates no known allergies.  Home Medications   Current Outpatient Rx  Name  Route  Sig  Dispense  Refill  . GABAPENTIN 300 MG PO CAPS   Oral   Take 300 mg by mouth 3 (three) times daily.         Marland Kitchen REBIF [place]   Subcutaneous   Inject into the skin.         Marland Kitchen LISINOPRIL-HYDROCHLOROTHIAZIDE 20-25 MG PO TABS   Oral   Take 1 tablet by mouth daily.         Marland Kitchen RANITIDINE HCL 150 MG PO TABS   Oral   Take 150 mg by mouth 2 (two) times daily.           Triage Vitals: BP 183/79  Pulse 74  Temp 98.4 F (36.9 C) (Oral)  Resp 20  SpO2 99%  Physical Exam  Nursing note and vitals reviewed. Constitutional: She is oriented to person, place, and time. She appears well-developed and well-nourished.  HENT:  Head: Normocephalic and atraumatic.  Eyes: EOM are normal. Pupils are equal, round, and reactive to light.  Neck: Normal range of motion. Neck supple.  Cardiovascular: Normal rate, regular rhythm, normal heart sounds and intact distal pulses.   Pulmonary/Chest: Effort normal and breath sounds normal.       Mild tenderness to left lower chest   Abdominal: Soft. Bowel sounds are normal. She exhibits no distension. There is no tenderness.  Musculoskeletal: Normal range of motion. She exhibits no edema and no tenderness.  Neurological: She is alert and oriented to person, place, and time. She has normal strength. No cranial nerve deficit or sensory deficit.  Skin: Skin is warm and dry. No rash noted.  Psychiatric: She has a normal mood and  affect.    ED Course  Procedures (including critical care time)  DIAGNOSTIC STUDIES: Oxygen Saturation is 99% on room air, normal by my interpretation.    COORDINATION OF CARE:  7:53 PM: Discussed treatment plan which includes an EKG, blood work and CXR with pt at bedside and pt agreed to plan.    Results for orders placed during the hospital encounter of 08/18/12  CBC WITH DIFFERENTIAL      Component Value Range   WBC 11.1 (*) 4.0 - 10.5 K/uL   RBC 4.13  3.87 - 5.11 MIL/uL   Hemoglobin 11.9 (*) 12.0 - 15.0 g/dL   HCT 16.1 (*) 09.6 - 04.5 %   MCV 85.7  78.0 - 100.0 fL   MCH 28.8  26.0 - 34.0 pg   MCHC 33.6  30.0 - 36.0 g/dL   RDW 40.9  81.1 - 91.4 %   Platelets 299  150 - 400 K/uL   Neutrophils Relative 54  43 - 77 %   Neutro Abs 6.0  1.7 - 7.7 K/uL   Lymphocytes Relative 38  12 -  46 %   Lymphs Abs 4.2 (*) 0.7 - 4.0 K/uL   Monocytes Relative 6  3 - 12 %   Monocytes Absolute 0.7  0.1 - 1.0 K/uL   Eosinophils Relative 2  0 - 5 %   Eosinophils Absolute 0.2  0.0 - 0.7 K/uL   Basophils Relative 0  0 - 1 %   Basophils Absolute 0.0  0.0 - 0.1 K/uL  COMPREHENSIVE METABOLIC PANEL      Component Value Range   Sodium 138  135 - 145 mEq/L   Potassium 3.9  3.5 - 5.1 mEq/L   Chloride 103  96 - 112 mEq/L   CO2 25  19 - 32 mEq/L   Glucose, Bld 111 (*) 70 - 99 mg/dL   BUN 11  6 - 23 mg/dL   Creatinine, Ser 7.82  0.50 - 1.10 mg/dL   Calcium 9.5  8.4 - 95.6 mg/dL   Total Protein 8.0  6.0 - 8.3 g/dL   Albumin 3.9  3.5 - 5.2 g/dL   AST 25  0 - 37 U/L   ALT 20  0 - 35 U/L   Alkaline Phosphatase 85  39 - 117 U/L   Total Bilirubin 0.2 (*) 0.3 - 1.2 mg/dL   GFR calc non Af Amer >90  >90 mL/min   GFR calc Af Amer >90  >90 mL/min  TROPONIN I      Component Value Range   Troponin I <0.30  <0.30 ng/mL  D-DIMER, QUANTITATIVE      Component Value Range   D-Dimer, Quant 0.32  0.00 - 0.48 ug/mL-FEU   Dg Chest 2 View  08/18/2012  *RADIOLOGY REPORT*  Clinical Data: Chest pain, shortness of breath, hypertension  CHEST - 2 VIEW  Comparison: 02/14/2009  Findings: Enlargement of cardiac silhouette with pulmonary vascular congestion. Mediastinal contours normal with minimal elongation of thoracic aorta again noted. Tiny scar at lingula. Lungs otherwise clear. No pleural effusion or pneumothorax. Bones unremarkable.  IMPRESSION: Enlargement of cardiac silhouette with pulmonary vascular congestion. No acute abnormalities. No interval change.   Original Report Authenticated By: Ulyses Southward, M.D.        No diagnosis found.    MDM   Date: 08/18/2012  Rate: 71  Rhythm: normal sinus rhythm  QRS Axis: normal  Intervals: normal  ST/T Wave abnormalities: normal  Conduction Disutrbances:none  Narrative Interpretation:  Old EKG Reviewed: none available    Labs unremarkable including Trop  x 2. Pt is low risk for ACS/CAD. Advised close PCP follow up for recheck.    I personally performed the services described in this documentation, which was scribed in my presence. The recorded information has been reviewed and is accurate.     Kathryn B. Bernette Mayers, MD 08/18/12 2232

## 2012-08-18 NOTE — ED Notes (Signed)
Sharp left sided chest pain with radiation into her left arm and leg this afternoon.

## 2014-12-14 ENCOUNTER — Encounter (HOSPITAL_COMMUNITY): Payer: Self-pay

## 2014-12-14 ENCOUNTER — Emergency Department (HOSPITAL_COMMUNITY)
Admission: EM | Admit: 2014-12-14 | Discharge: 2014-12-14 | Disposition: A | Discharge status: Home / Self Care | Payer: Medicare Other | Attending: Emergency Medicine | Admitting: Emergency Medicine

## 2014-12-14 ENCOUNTER — Emergency Department (HOSPITAL_COMMUNITY): Payer: Medicare Other

## 2014-12-14 DIAGNOSIS — I10 Essential (primary) hypertension: Secondary | ICD-10-CM | POA: Diagnosis not present

## 2014-12-14 DIAGNOSIS — X58XXXA Exposure to other specified factors, initial encounter: Secondary | ICD-10-CM | POA: Insufficient documentation

## 2014-12-14 DIAGNOSIS — Y9289 Other specified places as the place of occurrence of the external cause: Secondary | ICD-10-CM | POA: Insufficient documentation

## 2014-12-14 DIAGNOSIS — Y9389 Activity, other specified: Secondary | ICD-10-CM | POA: Insufficient documentation

## 2014-12-14 DIAGNOSIS — Z79899 Other long term (current) drug therapy: Secondary | ICD-10-CM | POA: Insufficient documentation

## 2014-12-14 DIAGNOSIS — S93402A Sprain of unspecified ligament of left ankle, initial encounter: Secondary | ICD-10-CM | POA: Insufficient documentation

## 2014-12-14 DIAGNOSIS — S99912A Unspecified injury of left ankle, initial encounter: Secondary | ICD-10-CM | POA: Diagnosis present

## 2014-12-14 DIAGNOSIS — Y998 Other external cause status: Secondary | ICD-10-CM | POA: Insufficient documentation

## 2014-12-14 DIAGNOSIS — G35 Multiple sclerosis: Secondary | ICD-10-CM | POA: Diagnosis not present

## 2014-12-14 MED ORDER — IBUPROFEN 800 MG PO TABS
800.0000 mg | ORAL_TABLET | Freq: Once | ORAL | Status: AC
  Administered 2014-12-14: 800 mg via ORAL
  Filled 2014-12-14: qty 1

## 2014-12-14 NOTE — Discharge Instructions (Signed)
Take ibuprofen and Tylenol, use ice as needed for pain. Use ace wrap as needed.  If you were given medicines take as directed.  If you are on coumadin or contraceptives realize their levels and effectiveness is altered by many different medicines.  If you have any reaction (rash, tongues swelling, other) to the medicines stop taking and see a physician.   Please follow up as directed and return to the ER or see a physician for new or worsening symptoms.  Thank you. Filed Vitals:   12/14/14 0849  BP: 108/65  Pulse: 75  Temp: 98.1 F (36.7 C)  TempSrc: Oral  Resp: 16  SpO2: 98%

## 2014-12-14 NOTE — ED Provider Notes (Signed)
CSN: [id number]     Arrival date & time 12/14/14  0840 History   First MD Initiated Contact with Patient 12/14/14 [phone]     Chief Complaint  Patient presents with  . Ankle Pain     (Consider location/radiation/quality/duration/timing/severity/associated sxs/prior Treatment) HPI Comments: 59 year old female with no fracture history, MS presents with left ankle pain worsening for the past 2 weeks, worse with weightbearing. Patient had ankle give out on her without direct trauma. No history of ankle problems. Patient has tried over-the-counter meds and wraps with minimal improvement.  Patient is a 59 y.o. female presenting with ankle pain. The history is provided by the patient.  Ankle Pain   Past Medical History  Diagnosis Date  . Hypertension   . Multiple sclerosis    Past Surgical History  Procedure Laterality Date  . C sections     History reviewed. No pertinent family history. History  Substance Use Topics  . Smoking status: Never Smoker   . Smokeless tobacco: Not on file  . Alcohol Use: No   OB History    No data available     Review of Systems  Musculoskeletal: Positive for joint swelling and gait problem.  Skin: Negative for rash.      Allergies  Review of patient's allergies indicates no known allergies.  Home Medications   Prior to Admission medications   Medication Sig Start Date End Date Taking? Authorizing Provider  gabapentin (NEURONTIN) 300 MG capsule Take 300 mg by mouth 3 (three) times daily.    Historical Provider, MD  Interferon Beta-1a (REBIF [place]) Inject into the skin.    Historical Provider, MD  lisinopril-hydrochlorothiazide (PRINZIDE,ZESTORETIC) 20-25 MG per tablet Take 1 tablet by mouth daily.    Historical Provider, MD  ranitidine (ZANTAC) 150 MG tablet Take 150 mg by mouth 2 (two) times daily.    Historical Provider, MD   BP 108/65 mmHg  Pulse 76  Temp(Src) 98.1 F (36.7 C) (Oral)  Resp 16  SpO2 96% Physical Exam  Constitutional: She  appears well-developed and well-nourished.  HENT:  Head: Normocephalic and atraumatic.  Cardiovascular: Normal rate.   Pulmonary/Chest: Effort normal.  Musculoskeletal: She exhibits edema and tenderness.  Patient has tenderness and mild swelling medial malleoli, no tenderness to the foot lateral malleoli or tibia/proximal fibula. Neurovascular intact.  Nursing note and vitals reviewed.   ED Course  Procedures (including critical care time) Labs Review Labs Reviewed - No data to display  Imaging Review No results found. No results found. Dg Ankle Complete Left  12/14/2014   CLINICAL DATA:  Acute left ankle pain for 2 weeks without known injury. Initial encounter.  EXAM: LEFT ANKLE COMPLETE - 3+ VIEW  COMPARISON:  None.  FINDINGS: There is no evidence of fracture, dislocation, or joint effusion. There is no evidence of arthropathy or other focal bone abnormality. Soft tissues are unremarkable.  IMPRESSION: Normal left ankle.   Electronically Signed   By: Lupita Raider, M.D.   On: 12/14/2014 09:18     EKG Interpretation None      MDM   Final diagnoses:  Left ankle sprain, initial encounter   Patient presents with low risk mechanism ankle injury, bony tenderness, x-ray pending, patient is not on pain meds at this time. X-ray reviewed no acute fracture. Results and differential diagnosis were discussed with the patient/parent/guardian. Close follow up outpatient was discussed, comfortable with the plan.   Medications  ibuprofen (ADVIL,MOTRIN) tablet 800 mg (800 mg Oral Given 12/14/14 1049)  Filed Vitals:   12/14/14 0849 12/14/14 1048  BP: 108/65   Pulse: 75 76  Temp: 98.1 F (36.7 C)   TempSrc: Oral   Resp: 16   SpO2: 98% 96%    Final diagnoses:  Left ankle sprain, initial encounter       Blane Ohara, MD 12/18/14 4098

## 2014-12-14 NOTE — ED Notes (Signed)
Per pt, 2 weeks ago felt ankle give out on her.  No fall.  Was walking up steps.  Using wraps, pain meds with no relief.

## 2015-01-15 ENCOUNTER — Emergency Department (HOSPITAL_COMMUNITY)
Admission: EM | Admit: 2015-01-15 | Discharge: 2015-01-15 | Disposition: A | Discharge status: Home / Self Care | Payer: Medicare Other | Attending: Emergency Medicine | Admitting: Emergency Medicine

## 2015-01-15 ENCOUNTER — Encounter (HOSPITAL_COMMUNITY): Payer: Self-pay | Admitting: Emergency Medicine

## 2015-01-15 DIAGNOSIS — M545 Low back pain: Secondary | ICD-10-CM | POA: Insufficient documentation

## 2015-01-15 DIAGNOSIS — I10 Essential (primary) hypertension: Secondary | ICD-10-CM | POA: Diagnosis not present

## 2015-01-15 DIAGNOSIS — Z8669 Personal history of other diseases of the nervous system and sense organs: Secondary | ICD-10-CM | POA: Diagnosis not present

## 2015-01-15 DIAGNOSIS — Z7951 Long term (current) use of inhaled steroids: Secondary | ICD-10-CM | POA: Diagnosis not present

## 2015-01-15 DIAGNOSIS — Z79899 Other long term (current) drug therapy: Secondary | ICD-10-CM | POA: Insufficient documentation

## 2015-01-15 DIAGNOSIS — R197 Diarrhea, unspecified: Secondary | ICD-10-CM | POA: Insufficient documentation

## 2015-01-15 DIAGNOSIS — R1030 Lower abdominal pain, unspecified: Secondary | ICD-10-CM | POA: Diagnosis not present

## 2015-01-15 DIAGNOSIS — M2550 Pain in unspecified joint: Secondary | ICD-10-CM | POA: Insufficient documentation

## 2015-01-15 LAB — COMPREHENSIVE METABOLIC PANEL
ALBUMIN: 4.1 g/dL (ref 3.5–5.0)
ALT: 21 U/L (ref 14–54)
ANION GAP: 13 (ref 5–15)
AST: 21 U/L (ref 15–41)
Alkaline Phosphatase: 75 U/L (ref 38–126)
BILIRUBIN TOTAL: 0.8 mg/dL (ref 0.3–1.2)
BUN: 13 mg/dL (ref 6–20)
CHLORIDE: 99 mmol/L — AB (ref 101–111)
CO2: 23 mmol/L (ref 22–32)
CREATININE: 0.77 mg/dL (ref 0.44–1.00)
Calcium: 9.3 mg/dL (ref 8.9–10.3)
GFR calc Af Amer: >60 mL/min (ref >60)
GFR calc non Af Amer: >60 mL/min (ref >60)
Glucose, Bld: 150 mg/dL — ABNORMAL HIGH (ref 70–99)
Potassium: 3.1 mmol/L — ABNORMAL LOW (ref 3.5–5.1)
Sodium: 135 mmol/L (ref 135–145)
TOTAL PROTEIN: 7.5 g/dL (ref 6.5–8.1)

## 2015-01-15 LAB — CBC WITH DIFFERENTIAL/PLATELET
BASOS ABS: 0 10*3/uL (ref 0.0–0.1)
Basophils Relative: 0 % (ref 0–1)
EOS ABS: 0.1 10*3/uL (ref 0.0–0.7)
EOS PCT: 1 % (ref 0–5)
HEMATOCRIT: 39.2 % (ref 36.0–46.0)
Hemoglobin: 13 g/dL (ref 12.0–15.0)
Lymphocytes Relative: 36 % (ref 12–46)
Lymphs Abs: 4.5 10*3/uL — ABNORMAL HIGH (ref 0.7–4.0)
MCH: 30 pg (ref 26.0–34.0)
MCHC: 33.2 g/dL (ref 30.0–36.0)
MCV: 90.3 fL (ref 78.0–100.0)
MONO ABS: 0.7 10*3/uL (ref 0.1–1.0)
Monocytes Relative: 6 % (ref 3–12)
Neutro Abs: 7.2 10*3/uL (ref 1.7–7.7)
Neutrophils Relative %: 57 % (ref 43–77)
PLATELETS: 314 10*3/uL (ref 150–400)
RBC: 4.34 MIL/uL (ref 3.87–5.11)
RDW: 13.6 % (ref 11.5–15.5)
WBC: 12.5 10*3/uL — AB (ref 4.0–10.5)

## 2015-01-15 LAB — URINALYSIS, ROUTINE W REFLEX MICROSCOPIC
BILIRUBIN URINE: NEGATIVE
GLUCOSE, UA: NEGATIVE mg/dL
KETONES UR: NEGATIVE mg/dL
Leukocytes, UA: NEGATIVE
Nitrite: NEGATIVE
PROTEIN: NEGATIVE mg/dL
Specific Gravity, Urine: 1.016 (ref 1.005–1.030)
UROBILINOGEN UA: 0.2 mg/dL (ref 0.0–1.0)
pH: 5.5 (ref 5.0–8.0)

## 2015-01-15 LAB — URINE MICROSCOPIC-ADD ON

## 2015-01-15 LAB — LIPASE, BLOOD: LIPASE: 28 U/L (ref 22–51)

## 2015-01-15 MED ORDER — DIPHENOXYLATE-ATROPINE 2.5-0.025 MG PO TABS: 1.0000 | ORAL_TABLET | Freq: Four times a day (QID) | ORAL | Status: AC | PRN

## 2015-01-15 MED ORDER — SODIUM CHLORIDE 0.9 % IV BOLUS (SEPSIS)
1000.0000 mL | Freq: Once | INTRAVENOUS | Status: AC
  Administered 2015-01-15: 1000 mL via INTRAVENOUS

## 2015-01-15 MED ORDER — POTASSIUM CHLORIDE CRYS ER 20 MEQ PO TBCR
40.0000 meq | EXTENDED_RELEASE_TABLET | Freq: Once | ORAL | Status: AC
  Administered 2015-01-15: 40 meq via ORAL
  Filled 2015-01-15: qty 2

## 2015-01-15 NOTE — ED Notes (Signed)
Pt reported lower abd and lower back pain, diarrhea and nausea without vomiting. Abd soft/nondistended but tender to palpate. BS (+) and active x4 quadrants.

## 2015-01-15 NOTE — ED Notes (Signed)
Awake. Verbally responsive. Resp even and unlabored. No audible adventitious breath sounds noted. ABC's intact. No N/V/D reported. IV infusing NS at 999ml/hr without difficulty. Family at bedside. 

## 2015-01-15 NOTE — ED Provider Notes (Signed)
CSN: [id number]     Arrival date & time 01/15/15  1517 History   First MD Initiated Contact with Patient 01/15/15 1556     Chief Complaint  Patient presents with  . Diarrhea  . Abdominal Pain     (Consider location/radiation/quality/duration/timing/severity/associated sxs/prior Treatment) HPI  59 year old female with a history of multiple sclerosis presents with lower abdominal pain, low back pain, and diarrhea that started 4 days ago. The patient states anytime she eats or drink seems to go straight through her with clear watery diarrhea. No blood in her stools. No fevers or chills. She has been having some aching in her joints but no joint swelling. Has felt "jittery". Is concerned that this is about to set off a multiple sclerosis flare. The patient states that her abdomen feels crampy and usually occurs when she eats something or when she is about to have diarrhea. No recent travel, sick contacts, or antibiotic use.  Past Medical History  Diagnosis Date  . Hypertension   . Multiple sclerosis    Past Surgical History  Procedure Laterality Date  . C sections     History reviewed. No pertinent family history. History  Substance Use Topics  . Smoking status: Never Smoker   . Smokeless tobacco: Not on file  . Alcohol Use: No   OB History    No data available     Review of Systems  Constitutional: Negative for fever.  Gastrointestinal: Positive for abdominal pain and diarrhea. Negative for nausea, vomiting and blood in stool.  Musculoskeletal: Positive for back pain and arthralgias.  All other systems reviewed and are negative.     Allergies  Review of patient's allergies indicates no known allergies.  Home Medications   Prior to Admission medications   Medication Sig Start Date End Date Taking? Authorizing Provider  fluticasone (FLONASE) 50 MCG/ACT nasal spray Place 1 spray into both nostrils daily as needed for allergies or rhinitis.   Yes Historical Provider, MD   gabapentin (NEURONTIN) 300 MG capsule Take 300 mg by mouth 3 (three) times daily as needed (nerve pain).    Yes Historical Provider, MD  ibuprofen (ADVIL,MOTRIN) 800 MG tablet Take 800 mg by mouth every 8 (eight) hours as needed for headache, mild pain, moderate pain or cramping.  06/08/14  Yes Historical Provider, MD  Interferon Beta-1a (REBIF [place]) Inject 44 mcg into the skin 3 (three) times a week.    Yes Historical Provider, MD  lisinopril-hydrochlorothiazide (PRINZIDE,ZESTORETIC) 20-25 MG per tablet Take 1 tablet by mouth daily.   Yes Historical Provider, MD  potassium chloride (K-DUR,KLOR-CON) 10 MEQ tablet Take 1 tablet by mouth daily. 04/14/14  Yes Historical Provider, MD  ranitidine (ZANTAC) 150 MG tablet Take 150 mg by mouth 2 (two) times daily.   Yes Historical Provider, MD  temazepam (RESTORIL) 15 MG capsule Take 15 mg by mouth at bedtime. 01/05/14  Yes Historical Provider, MD  valACYclovir (VALTREX) 1000 MG tablet Take 2,000 mg by mouth 2 (two) times daily as needed (cold sores).  11/23/14  Yes Historical Provider, MD   BP 115/61 mmHg  Pulse 90  Temp(Src) 97.9 F (36.6 C) (Oral)  Resp 18  SpO2 96% Physical Exam  Constitutional: She is oriented to person, place, and time. She appears well-developed and well-nourished.  HENT:  Head: Normocephalic and atraumatic.  Right Ear: External ear normal.  Left Ear: External ear normal.  Nose: Nose normal.  Eyes: Right eye exhibits no discharge. Left eye exhibits no discharge.  Cardiovascular: Normal  rate, regular rhythm and normal heart sounds.   Pulmonary/Chest: Effort normal and breath sounds normal.  Abdominal: Soft. She exhibits no distension. There is no tenderness.  Neurological: She is alert and oriented to person, place, and time.  Skin: Skin is warm and dry. She is not diaphoretic.  Nursing note and vitals reviewed.   ED Course  Procedures (including critical care time) Labs Review Labs Reviewed  CBC WITH DIFFERENTIAL/PLATELET  - Abnormal; Notable for the following:    WBC 12.5 (*)    Lymphs Abs 4.5 (*)    All other components within normal limits  COMPREHENSIVE METABOLIC PANEL - Abnormal; Notable for the following:    Potassium 3.1 (*)    Chloride 99 (*)    Glucose, Bld 150 (*)    All other components within normal limits  URINALYSIS, ROUTINE W REFLEX MICROSCOPIC - Abnormal; Notable for the following:    Hgb urine dipstick TRACE (*)    All other components within normal limits  URINE MICROSCOPIC-ADD ON - Abnormal; Notable for the following:    Squamous Epithelial / LPF FEW (*)    All other components within normal limits  LIPASE, BLOOD    Imaging Review No results found.   EKG Interpretation None      MDM   Final diagnoses:  Diarrhea    Patient with diarrhea and associated abdominal cramping. Feels better after IV fluids. Benign abdominal exam, do not feel CT imaging is indicated. No high-risk features for more severe diarrheal disease. Given IV suspicion for an acute bacterial illness, I have offered her Lomotil and recommended diet changes to help reduce her diarrhea. She will follow-up with her PCP and I discussed strict return precautions.    Pricilla Loveless, MD 01/16/15 [phone]

## 2015-01-15 NOTE — ED Notes (Addendum)
Pt with Hx of multiple sclerosis c/o low abdomen pain, diarrhea and aching in her joints and low back onset Wednesday. Pt denies n/v.

## 2015-02-03 ENCOUNTER — Encounter: Payer: Self-pay | Admitting: Gastroenterology

## 2015-04-26 ENCOUNTER — Other Ambulatory Visit: Payer: Self-pay | Admitting: Family Medicine

## 2015-04-26 DIAGNOSIS — Z1231 Encounter for screening mammogram for malignant neoplasm of breast: Secondary | ICD-10-CM

## 2015-05-06 ENCOUNTER — Ambulatory Visit
Admission: RE | Admit: 2015-05-06 | Discharge: 2015-05-06 | Disposition: A | Discharge status: Home / Self Care | Payer: Medicare Other | Source: Ambulatory Visit | Attending: Family Medicine | Admitting: Family Medicine

## 2015-05-06 DIAGNOSIS — Z1231 Encounter for screening mammogram for malignant neoplasm of breast: Secondary | ICD-10-CM

## 2015-05-12 ENCOUNTER — Other Ambulatory Visit: Payer: Self-pay | Admitting: Family Medicine

## 2015-05-12 DIAGNOSIS — R928 Other abnormal and inconclusive findings on diagnostic imaging of breast: Secondary | ICD-10-CM

## 2015-05-20 ENCOUNTER — Ambulatory Visit
Admission: RE | Admit: 2015-05-20 | Discharge: 2015-05-20 | Disposition: A | Discharge status: Home / Self Care | Payer: Medicare Other | Source: Ambulatory Visit | Attending: Family Medicine | Admitting: Family Medicine

## 2015-05-20 DIAGNOSIS — R928 Other abnormal and inconclusive findings on diagnostic imaging of breast: Secondary | ICD-10-CM

## 2015-06-14 ENCOUNTER — Other Ambulatory Visit: Payer: Self-pay | Admitting: Family Medicine

## 2015-06-14 DIAGNOSIS — R928 Other abnormal and inconclusive findings on diagnostic imaging of breast: Secondary | ICD-10-CM

## 2015-06-24 ENCOUNTER — Ambulatory Visit: Payer: Medicare Other | Admitting: Dietician

## 2015-07-22 ENCOUNTER — Ambulatory Visit: Payer: Medicare Other | Admitting: *Deleted

## 2015-08-26 ENCOUNTER — Ambulatory Visit: Payer: Medicare Other | Admitting: *Deleted

## 2016-03-15 ENCOUNTER — Encounter (INDEPENDENT_AMBULATORY_CARE_PROVIDER_SITE_OTHER): Payer: Self-pay | Admitting: Ophthalmology

## 2016-12-10 IMAGING — MG MM SCREENING BREAST TOMO BILATERAL
5 series · 6 of 9 positions shown · non-contrast
Comparison: Previous exam(s).

CLINICAL DATA: Screening.

EXAM:
DIGITAL SCREENING BILATERAL MAMMOGRAM WITH 3D TOMO WITH CAD

[R CC]
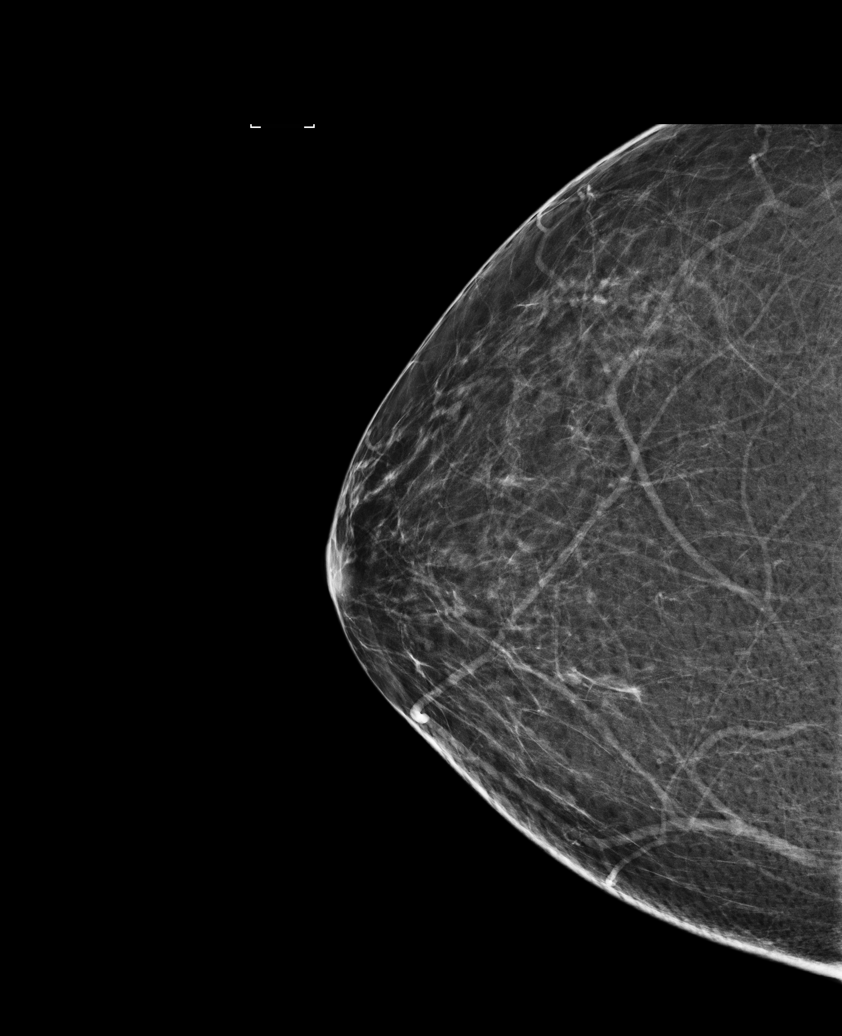

[L MLO]
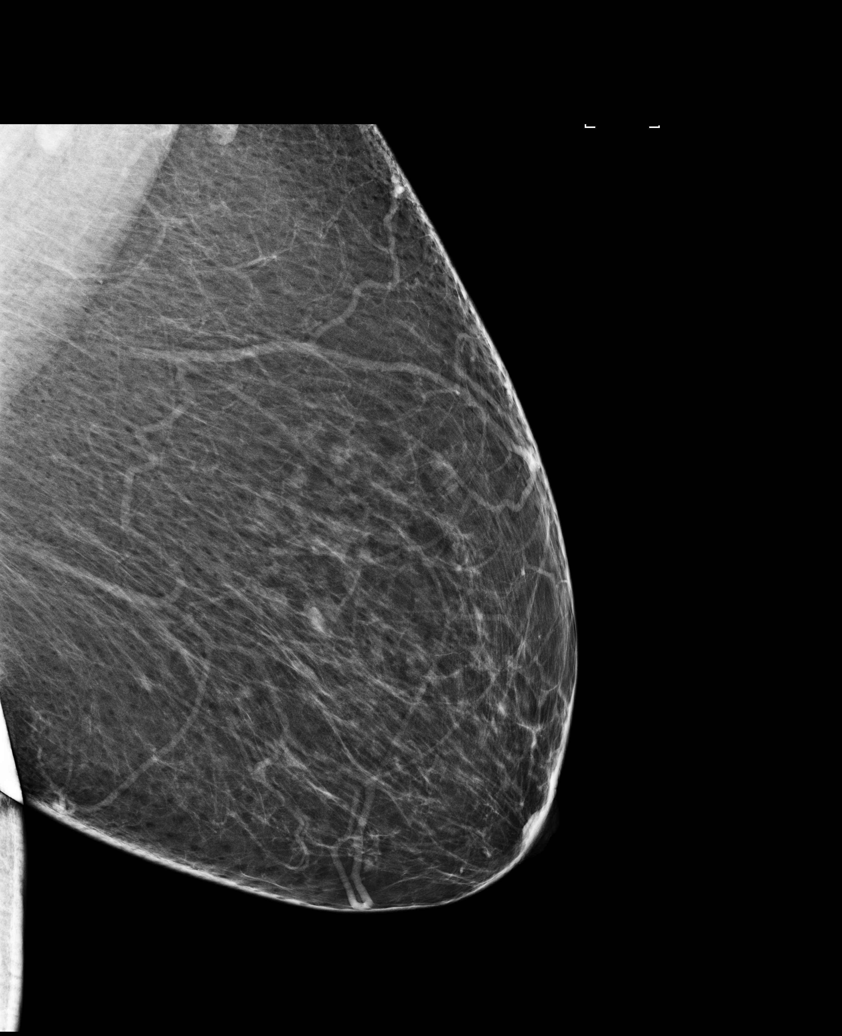

[R MLO]
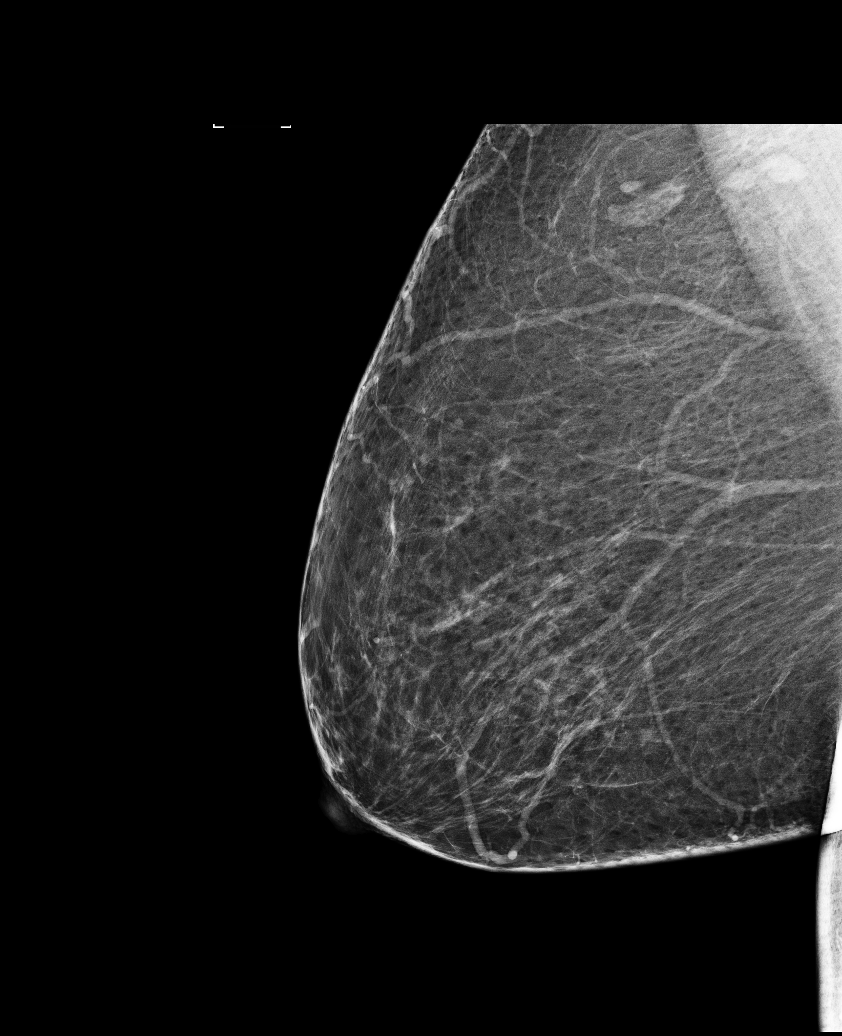

[L CC]
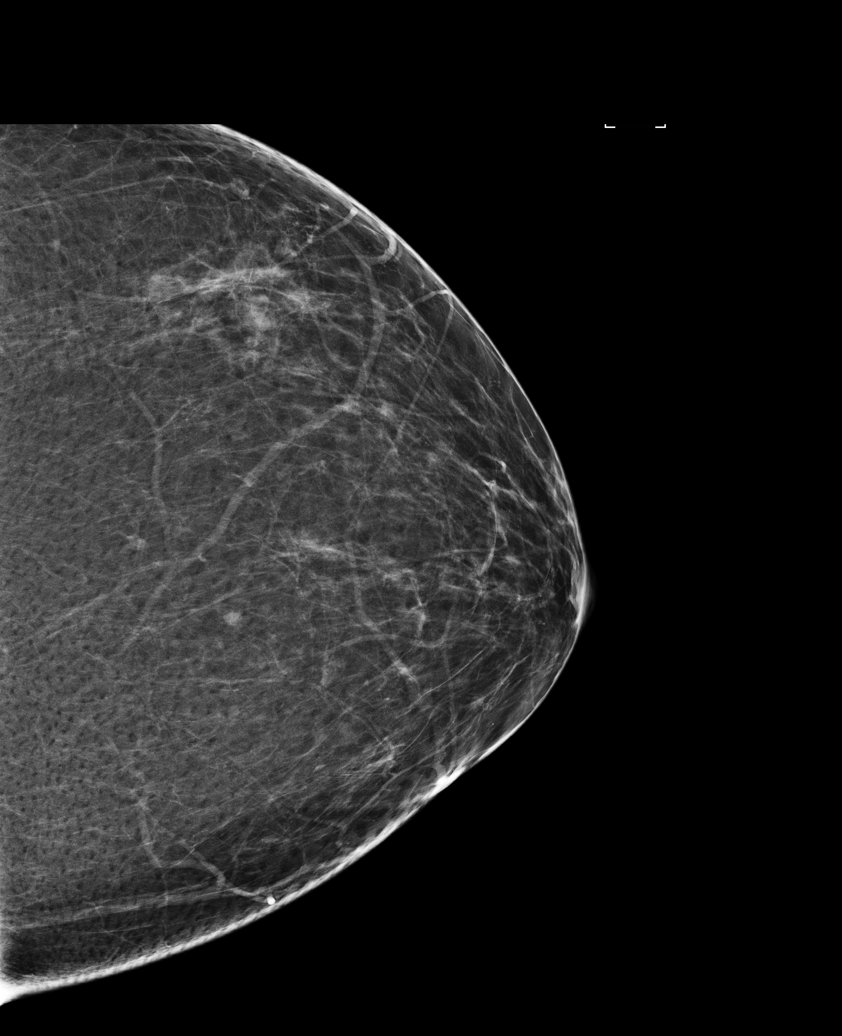

[L CC tomo · 2 of 64 frames shown]
[frame 21/64]
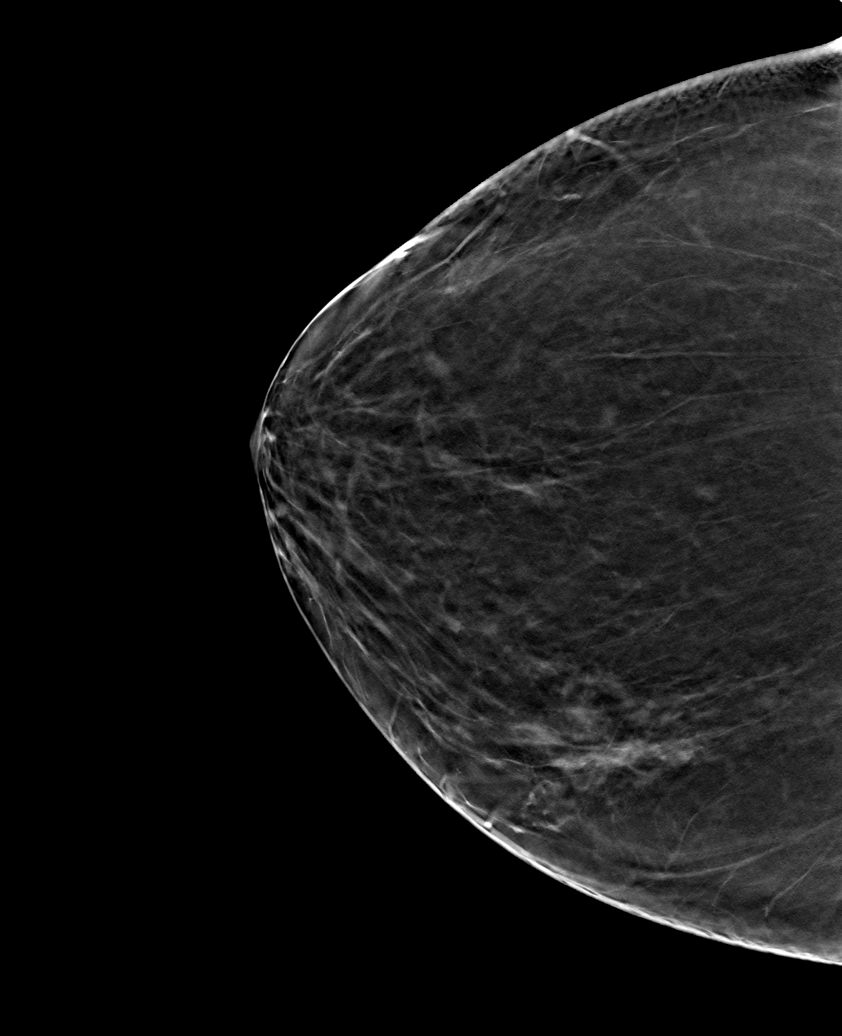
[frame 33/64]
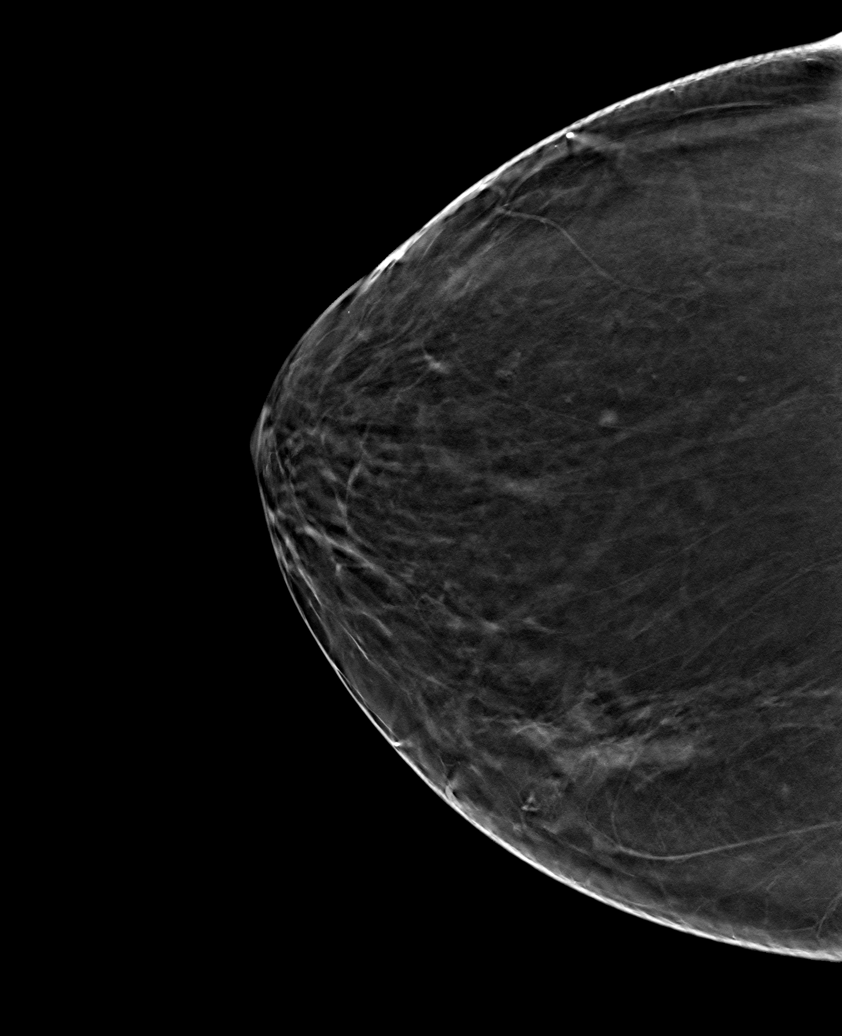

[6 of 9 positions shown; findings below may reference images not displayed]

ACR Breast Density Category b: There are scattered areas of
fibroglandular density.
FINDINGS: In the left breast, a possible mass warrants further evaluation. In
the right breast, no findings suspicious for malignancy.

Images were processed with CAD.
IMPRESSION: Further evaluation is suggested for possible mass in the left
breast.

RECOMMENDATION:
Diagnostic mammogram and possibly ultrasound of the left breast.
(Code:IR-6-NN1)

The patient will be contacted regarding the findings, and additional
imaging will be scheduled.

BI-RADS CATEGORY  0: Incomplete. Need additional imaging evaluation
and/or prior mammograms for comparison.

## 2017-11-28 NOTE — Progress Notes (Signed)
[facility] - Clinic Note  11/29/2017     CHIEF COMPLAINT Patient presents for Retina Evaluation   HISTORY OF PRESENT ILLNESS: Kathryn Gross is a 62 y.o. female who presents to the clinic today for:   HPI    Retina Evaluation    In both eyes.  Associated Symptoms Negative for Flashes, Distortion, Pain, Photophobia, Trauma, Jaw Claudication, Fever, Fatigue, Weight Loss, Shoulder/Hip pain, Scalp Tenderness, Glare, Redness, Blind Spot and Floaters.  Context:  distance vision, driving, night driving and dim lighting.  Treatments tried include no treatments.  I, the attending physician,  performed the HPI with the patient and updated documentation appropriately.          Comments    62 y/o female pt here for retinal eval for clearance for cat sx ou.  Pt does not recall cataract surgeon's name.  Pt is scheduled for cat sx os first next month.  Cat eval was done about 2 wks ago.  Pt has some scar tissue behind her left eye, that is suspected to be due to her MS diagnosis.  Scar tissue was first discovered in 2000.  Vision ou blurred.  Denies pain, flashes, floaters.  No gtts.       Last edited by Kathryn Chris, MD on 11/29/2017 10:53 AM. (History)    Pt states she is "getting ready to have cataract sx" with Dr. Wynelle Gross; pt states she was told she "lost sight" OS when she was dx with MS; pt states she has not had any flare ups since initial dx; Pt states she began to notice decrease VA OU and was seen by Dr. Wynelle Gross; Pt states she is on rebif for MS;   Referring physician: Augustin Schooling, MD [address] [place], [place] [address]  HISTORICAL INFORMATION:   Selected notes from the MEDICAL RECORD NUMBER Referred by Dr. Roney Gross for surgical clearance;  LEE- 03.08.19 (L. Sun) [[facility] OD: 20/25-2 OS: 20/30] Ocular Hx- retinal scarring OU, h/o of MS w/ optic neuritis OS, cataract OU PMH- MS, HTN    CURRENT MEDICATIONS: No current outpatient medications on file. (Ophthalmic Drugs)    No current facility-administered medications for this visit.  (Ophthalmic Drugs)   Current Outpatient Medications (Other)  Medication Sig  . diphenoxylate-atropine (LOMOTIL) 2.5-0.025 MG per tablet Take 1 tablet by mouth 4 (four) times daily as needed for diarrhea or loose stools.  . fluticasone (FLONASE) 50 MCG/ACT nasal spray Place 1 spray into both nostrils daily as needed for allergies or rhinitis.  Marland Kitchen gabapentin (NEURONTIN) 300 MG capsule Take 300 mg by mouth 3 (three) times daily as needed (nerve pain).   Marland Kitchen glimepiride (AMARYL) 1 MG tablet Take by mouth.  Marland Kitchen ibuprofen (ADVIL,MOTRIN) 800 MG tablet Take 800 mg by mouth every 8 (eight) hours as needed for headache, mild pain, moderate pain or cramping.   . Interferon Beta-1a (REBIF [place]) Inject 44 mcg into the skin 3 (three) times a week.   Marland Kitchen lisinopril-hydrochlorothiazide (PRINZIDE,ZESTORETIC) 20-25 MG per tablet Take 1 tablet by mouth daily.  . magnesium 30 MG tablet Take by mouth.  . potassium chloride (K-DUR,KLOR-CON) 10 MEQ tablet Take 1 tablet by mouth daily.  . ranitidine (ZANTAC) 150 MG tablet Take 150 mg by mouth 2 (two) times daily.  . temazepam (RESTORIL) 15 MG capsule Take 15 mg by mouth at bedtime.  . valACYclovir (VALTREX) 1000 MG tablet Take 2,000 mg by mouth 2 (two) times daily as needed (cold sores).  No current facility-administered medications for this visit.  (Other)      REVIEW OF SYSTEMS: ROS    Positive for: Eyes   Negative for: Constitutional, Gastrointestinal, Neurological, Skin, Genitourinary, Musculoskeletal, HENT, Endocrine, Cardiovascular, Respiratory, Psychiatric, Allergic/Imm, Heme/Lymph   Last edited by Celine Gross, COA on 11/29/2017  9:28 AM. (History)       ALLERGIES No Known Allergies  PAST MEDICAL HISTORY Past Medical History:  Diagnosis Date  . Cataract   . Hypertension   . Multiple sclerosis (HCC)    Past Surgical History:  Procedure Laterality Date  . c sections      FAMILY  HISTORY History reviewed. No pertinent family history.  SOCIAL HISTORY Social History   Tobacco Use  . Smoking status: Never Smoker  Substance Use Topics  . Alcohol use: No  . Drug use: No         OPHTHALMIC EXAM:  Base Eye Exam    Visual Acuity (Snellen - Linear)      Right Left   Dist [place] 20/30 -1 20/30 -1   Dist ph [place] 20/30 +1 NI       Tonometry (Tonopen, 9:24 AM)      Right Left   Pressure 27 22  Pt was very nervous while checking pressures.  Squeezing hard.       Pupils      Dark Light Shape React APD   Right 4 3 Round Brisk None   Left 4 3 Round Brisk None       Visual Fields (Counting fingers)      Left Right    Full    Restrictions  Partial outer inferior nasal deficiency       Extraocular Movement      Right Left    Full Full       Neuro/Psych    Oriented x3:  Yes   Mood/Affect:  Normal       Dilation    Both eyes:  1.0% Mydriacyl, 2.5% Phenylephrine @ 9:23 AM        Slit Lamp and Fundus Exam    Slit Lamp Exam      Right Left   Lids/Lashes Normal Normal   Conjunctiva/Sclera Melanosis Melanosis   Cornea Clear Clear   Anterior Chamber Deep and quiet Deep and quiet   Iris Round and dilated to 6mm Round and dilated to 6mm   Lens 2-3+ Nuclear sclerosis, 3+ Cortical cataract, 1+ Posterior subcapsular cataract 2+ Nuclear sclerosis, 3+ Cortical cataract, 1+ Posterior subcapsular cataract   Vitreous Vitreous syneresis Vitreous syneresis       Fundus Exam      Right Left   Disc Normal Normal, ? Temporal pallor   C/D Ratio 0.55 0.5   Macula Good foveal reflex, , Retinal pigment epithelial mottling temporal to fovea, patch of darkly pigmented atrophy along ST arcade Good foveal reflex, No heme or edema   Vessels Sheathing of superior venule at 1200 Normal   Periphery perivascular pigmentation at 0200, 0600, otherwise attached perivascular pigmentation at 0900, otherwise attached        Refraction    Manifest Refraction      Sphere  Cylinder Dist VA   Right [place] Sphere 20/30   Left +0.25 Sphere 20/30          IMAGING AND PROCEDURES  Imaging and Procedures for 12/04/17  OCT, Retina - OU - Both Eyes       Right Eye Quality was borderline. Central Foveal Thickness: 264.  Progression has no prior data. Findings include normal foveal contour, no IRF, no SRF.   Left Eye Quality was borderline. Central Foveal Thickness: 242. Progression has no prior data. Findings include normal foveal contour, no IRF, no SRF.   Notes *Images captured and stored on drive  Diagnosis / Impression:  NFP, No IRF/SRF  Clinical management:  See below  Abbreviations: NFP - Normal foveal profile. CME - cystoid macular edema. PED - pigment epithelial detachment. IRF - intraretinal fluid. SRF - subretinal fluid. EZ - ellipsoid zone. ERM - epiretinal membrane. ORA - outer retinal atrophy. ORT - outer retinal tubulation. SRHM - subretinal hyper-reflective material         Fluorescein Angiography Optos (Transit OD)       Right Eye Progression has no prior data. Early phase findings include blockage, staining. Mid/Late phase findings include blockage, staining.   Left Eye Progression has no prior data. Early phase findings include blockage, staining, normal observations. Mid/Late phase findings include blockage, staining, normal observations.   Notes Impression:  OD: hypofl blockage and late staining corresponding to pigment deposits on color fundus photos. No active vasculitis or leakage OS: hypofl blockage and late staining corresponding to pigment deposits on color fundus photos. No active vasculitis or leakage                ASSESSMENT/PLAN:    ICD-10-CM   1. Retinal edema H35.81 OCT, Retina - OU - Both Eyes    Fluorescein Angiography Optos (Transit OD)  2. Combined forms of age-related cataract of both eyes H25.813     1,2,3. 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 No retinal edema on exam or OCT  5. Combined form age-related cataract OU-  - The symptoms of cataract, surgical options, and treatments and risks were discussed with patient. - discussed diagnosis and progression - visually significant OU - under the expert management of Dr. Fredderick Phenix - cleared from a retina standpoint to proceed with cataract surgery as scheduled with Dr. Wynelle Gross. - OS scheduled April 25th, OD 3 weeks after   Ophthalmic Meds Ordered this visit:  No orders of the defined types were placed in this encounter.      Return in about 3 months (around 03/01/2018) for F/U MS following CE/IOL OU.  There are no Patient Instructions on file for this visit.   Explained the diagnoses, plan, and follow up with the patient and they expressed understanding.  Patient expressed understanding of the importance of proper follow up care.   This document serves as a record of services personally performed by Karie Chimera, MD, PhD. It was created on their behalf by Virgilio Belling, COA, a certified ophthalmic assistant. The creation of this record is the provider's dictation and/or activities during the visit.  Electronically signed by: Virgilio Belling, COA  12/04/17 2:28 AM   Karie Chimera, M.D., Ph.D. Diseases & Surgery of the Retina and Vitreous [facility]  12/04/17  I have reviewed the above documentation for accuracy and  completeness, and I agree with the above. Karie Chimera, M.D., Ph.D. 12/04/17 2:38 AM     Abbreviations: M myopia (nearsighted); A astigmatism; H hyperopia (farsighted); P presbyopia; Mrx spectacle prescription;  CTL contact lenses; OD right eye; OS left eye; OU both eyes  XT exotropia; ET esotropia; PEK punctate epithelial keratitis; PEE punctate epithelial erosions; DES dry eye syndrome; MGD meibomian gland dysfunction; ATs artificial tears; PFAT's preservative free artificial tears; NSC nuclear sclerotic cataract; PSC posterior subcapsular cataract; ERM epi-retinal membrane; PVD posterior vitreous detachment; RD retinal detachment; DM diabetes mellitus; DR diabetic retinopathy; NPDR non-proliferative diabetic retinopathy; PDR proliferative diabetic retinopathy; CSME clinically significant macular edema; DME diabetic macular edema; dbh dot blot hemorrhages; CWS cotton wool spot; POAG primary open angle glaucoma; C/D cup-to-disc ratio; HVF humphrey visual field; GVF goldmann visual field; OCT optical coherence tomography; IOP intraocular pressure; BRVO Branch retinal vein occlusion; CRVO central retinal vein occlusion; CRAO central retinal artery occlusion; BRAO branch retinal artery occlusion; RT retinal tear; SB scleral buckle; PPV pars plana vitrectomy; VH Vitreous hemorrhage; PRP panretinal laser photocoagulation; IVK intravitreal kenalog; VMT vitreomacular traction; MH Macular hole;  NVD neovascularization of the disc; NVE neovascularization elsewhere; AREDS age related eye disease study; ARMD age related macular degeneration; POAG primary open angle glaucoma; EBMD epithelial/anterior basement membrane dystrophy; ACIOL anterior chamber intraocular lens; IOL intraocular lens; PCIOL posterior chamber intraocular lens; Phaco/IOL phacoemulsification with intraocular lens placement; PRK photorefractive keratectomy; LASIK laser assisted in situ keratomileusis; HTN hypertension; DM diabetes mellitus; COPD  chronic obstructive pulmonary disease

## 2017-11-29 ENCOUNTER — Ambulatory Visit (INDEPENDENT_AMBULATORY_CARE_PROVIDER_SITE_OTHER): Payer: Medicare Other | Admitting: Ophthalmology

## 2017-11-29 ENCOUNTER — Encounter (INDEPENDENT_AMBULATORY_CARE_PROVIDER_SITE_OTHER): Payer: Self-pay | Admitting: Ophthalmology

## 2017-11-29 DIAGNOSIS — Z8669 Personal history of other diseases of the nervous system and sense organs: Secondary | ICD-10-CM | POA: Diagnosis not present

## 2017-11-29 DIAGNOSIS — H31109 Choroidal degeneration, unspecified, unspecified eye: Secondary | ICD-10-CM | POA: Diagnosis not present

## 2017-11-29 DIAGNOSIS — H25813 Combined forms of age-related cataract, bilateral: Secondary | ICD-10-CM

## 2017-11-29 DIAGNOSIS — G35 Multiple sclerosis: Secondary | ICD-10-CM

## 2017-11-29 DIAGNOSIS — H3581 Retinal edema: Secondary | ICD-10-CM

## 2017-12-04 ENCOUNTER — Encounter (INDEPENDENT_AMBULATORY_CARE_PROVIDER_SITE_OTHER): Payer: Self-pay | Admitting: Ophthalmology

## 2018-03-04 ENCOUNTER — Encounter (INDEPENDENT_AMBULATORY_CARE_PROVIDER_SITE_OTHER): Payer: Medicare Other | Admitting: Ophthalmology

## 2018-05-31 ENCOUNTER — Other Ambulatory Visit: Payer: Self-pay

## 2018-05-31 ENCOUNTER — Emergency Department (HOSPITAL_BASED_OUTPATIENT_CLINIC_OR_DEPARTMENT_OTHER)
Admission: EM | Admit: 2018-05-31 | Discharge: 2018-05-31 | Disposition: A | Discharge status: Home / Self Care | Payer: Medicare Other | Attending: Emergency Medicine | Admitting: Emergency Medicine

## 2018-05-31 ENCOUNTER — Emergency Department (HOSPITAL_BASED_OUTPATIENT_CLINIC_OR_DEPARTMENT_OTHER): Payer: Medicare Other

## 2018-05-31 ENCOUNTER — Encounter (HOSPITAL_BASED_OUTPATIENT_CLINIC_OR_DEPARTMENT_OTHER): Payer: Self-pay | Admitting: Emergency Medicine

## 2018-05-31 DIAGNOSIS — R05 Cough: Secondary | ICD-10-CM | POA: Diagnosis present

## 2018-05-31 DIAGNOSIS — Z79899 Other long term (current) drug therapy: Secondary | ICD-10-CM | POA: Insufficient documentation

## 2018-05-31 DIAGNOSIS — J209 Acute bronchitis, unspecified: Secondary | ICD-10-CM | POA: Insufficient documentation

## 2018-05-31 DIAGNOSIS — I10 Essential (primary) hypertension: Secondary | ICD-10-CM | POA: Insufficient documentation

## 2018-05-31 MED ORDER — ALBUTEROL SULFATE (2.5 MG/3ML) 0.083% IN NEBU
2.5000 mg | INHALATION_SOLUTION | Freq: Once | RESPIRATORY_TRACT | Status: AC
  Administered 2018-05-31: 2.5 mg via RESPIRATORY_TRACT
  Filled 2018-05-31: qty 3

## 2018-05-31 MED ORDER — GUAIFENESIN 100 MG/5ML PO LIQD: 100.0000 mg | ORAL | 0 refills | Status: AC | PRN

## 2018-05-31 MED ORDER — ALBUTEROL SULFATE HFA 108 (90 BASE) MCG/ACT IN AERS: 1.0000 | INHALATION_SPRAY | Freq: Four times a day (QID) | RESPIRATORY_TRACT | 0 refills | Status: AC | PRN

## 2018-05-31 NOTE — ED Notes (Signed)
Patient transported to X-ray 

## 2018-05-31 NOTE — Discharge Instructions (Addendum)
You may use the albuterol inhaler every 4-6 hours as needed for shortness of breath or cough.  If you need it more than this or you develop fever, worsening shortness of breath, vomiting, or any other new/concerning symptoms and return to the ER for evaluation.

## 2018-05-31 NOTE — ED Triage Notes (Signed)
Patient states that she has had a cough with some audible wheezing since Thursday  - worsening coughing spells during the night

## 2018-05-31 NOTE — ED Notes (Signed)
ED Provider at bedside. 

## 2018-05-31 NOTE — ED Provider Notes (Signed)
[facility] EMERGENCY DEPARTMENT Provider Note   CSN: [id number] Arrival date & time: 05/31/18  0908     History   Chief Complaint Chief Complaint  Patient presents with  . Cough    HPI Kathryn Gross is a 62 y.o. female.  HPI  62 year old female presents with cough.  She has a history of MS.  She states that starting 2 nights ago she developed cough that is nonproductive and chest tightness.  Feels like there is something in her chest she can get out.  No nasal congestion but she is having a sore throat.  No fevers.  She has some on hand cephalexin she is been taking but it has not helped.  Does not really feel short of breath.  Cough seems to be worse at night.  No leg swelling or focal weakness. No known lung problems.  Past Medical History:  Diagnosis Date  . Cataract   . Hypertension   . Multiple sclerosis (HCC)     There are no active problems to display for this patient.   Past Surgical History:  Procedure Laterality Date  . c sections       OB History   None      Home Medications    Prior to Admission medications   Medication Sig Start Date End Date Taking? Authorizing Provider  albuterol (PROVENTIL HFA;VENTOLIN HFA) 108 (90 Base) MCG/ACT inhaler Inhale 1-2 puffs into the lungs every 6 (six) hours as needed for wheezing or shortness of breath. 05/31/18   Pricilla Loveless, MD  diphenoxylate-atropine (LOMOTIL) 2.5-0.025 MG per tablet Take 1 tablet by mouth 4 (four) times daily as needed for diarrhea or loose stools. 01/15/15   Pricilla Loveless, MD  fluticasone (FLONASE) 50 MCG/ACT nasal spray Place 1 spray into both nostrils daily as needed for allergies or rhinitis.    [provider]  gabapentin (NEURONTIN) 300 MG capsule Take 300 mg by mouth 3 (three) times daily as needed (nerve pain).     [provider]  glimepiride (AMARYL) 1 MG tablet Take by mouth.    [provider]  guaiFENesin (ROBITUSSIN) 100 MG/5ML liquid  Take 5-10 mLs (100-200 mg total) by mouth every 4 (four) hours as needed for cough. 05/31/18   Pricilla Loveless, MD  ibuprofen (ADVIL,MOTRIN) 800 MG tablet Take 800 mg by mouth every 8 (eight) hours as needed for headache, mild pain, moderate pain or cramping.  06/08/14   [provider]  Interferon Beta-1a (REBIF [place]) Inject 44 mcg into the skin 3 (three) times a week.     [provider]  lisinopril-hydrochlorothiazide (PRINZIDE,ZESTORETIC) 20-25 MG per tablet Take 1 tablet by mouth daily.    [provider]  magnesium 30 MG tablet Take by mouth.    [provider]  potassium chloride (K-DUR,KLOR-CON) 10 MEQ tablet Take 1 tablet by mouth daily. 04/14/14   [provider]  ranitidine (ZANTAC) 150 MG tablet Take 150 mg by mouth 2 (two) times daily.    [provider]  temazepam (RESTORIL) 15 MG capsule Take 15 mg by mouth at bedtime. 01/05/14   [provider]  valACYclovir (VALTREX) 1000 MG tablet Take 2,000 mg by mouth 2 (two) times daily as needed (cold sores).  11/23/14   [provider]    Family History History reviewed. No pertinent family history.  Social History Social History   Tobacco Use  . Smoking status: Never Smoker  . Smokeless tobacco: Never Used  Substance Use  Topics  . Alcohol use: No  . Drug use: No     Allergies   Patient has no known allergies.   Review of Systems Review of Systems  Constitutional: Negative for fever.  HENT: Positive for congestion and sore throat.   Respiratory: Positive for cough and chest tightness. Negative for shortness of breath.   Cardiovascular: Negative for chest pain and leg swelling.  Gastrointestinal: Negative for vomiting.  All other systems reviewed and are negative.    Physical Exam Updated Vital Signs BP 133/69 (BP Location: Left Arm)   Pulse 60   Temp 98.3 F (36.8 C) (Oral)   Resp 20   Ht 5\' 4"  (1.626 m)   Wt 104.3 kg   SpO2 98%   BMI 39.48 kg/m     Physical Exam  Constitutional: She is oriented to person, place, and time. She appears well-developed and well-nourished. No distress.  HENT:  Head: Normocephalic and atraumatic.  Right Ear: External ear normal.  Left Ear: External ear normal.  Nose: Nose normal.  Mouth/Throat: No oropharyngeal exudate.  Eyes: Right eye exhibits no discharge. Left eye exhibits no discharge.  Cardiovascular: Normal rate, regular rhythm and normal heart sounds.  Pulmonary/Chest: Effort normal. No accessory muscle usage. No tachypnea. She has wheezes (slight expiratory).  Abdominal: She exhibits no distension.  Musculoskeletal: She exhibits no edema.  Neurological: She is alert and oriented to person, place, and time.  Skin: Skin is warm and dry. She is not diaphoretic.  Nursing note and vitals reviewed.    ED Treatments / Results  Labs (all labs ordered are listed, but only abnormal results are displayed) Labs Reviewed - No data to display  EKG EKG Interpretation  Date/Time:  Saturday May 31 2018 09:48:43 EDT Ventricular Rate:  63 PR Interval:    QRS Duration: 107 QT Interval:  435 QTC Calculation: 446 R Axis:   20 Text Interpretation:  Age not entered, assumed to be  62 years old for purpose of ECG interpretation Sinus rhythm Low voltage, precordial leads Borderline T abnormalities, anterior leads Confirmed by Pricilla Loveless [phone]) on 05/31/2018 10:04:34 AM   Radiology Dg Chest 2 View  Result Date: 05/31/2018 CLINICAL DATA:  Cough with audible wheezing for 2 days. History of hypertension and multiple sclerosis. EXAM: CHEST - 2 VIEW COMPARISON:  08/18/2012 and 02/14/2009 radiographs. FINDINGS: There is stable mild cardiomegaly. The mediastinal contours are normal. The lungs are clear. There is no pleural effusion or pneumothorax. No acute osseous findings are evident. Degenerative changes are present throughout the spine. Multiple telemetry leads overlie the chest. IMPRESSION: Stable  chest without acute cardiopulmonary process. Stable cardiomegaly. Electronically Signed   By: Carey Bullocks M.D.   On: 05/31/2018 10:12    Procedures Procedures (including critical care time)  Medications Ordered in ED Medications  albuterol (PROVENTIL) (2.5 MG/3ML) 0.083% nebulizer solution 2.5 mg (2.5 mg Nebulization Given 05/31/18 0943)     Initial Impression / Assessment and Plan / ED Course  I have reviewed the triage vital signs and the nursing notes.  Pertinent labs & imaging results that were available during my care of the patient were reviewed by me and considered in my medical decision making (see chart for details).     Presentation is most consistent with an acute bronchitis.  Probably viral, especially given no focal infiltrate on chest x-ray and no fever.  Albuterol has helped a little bit and so I will prescribe her an inhaler which she has used before.  Also  discharged with guaifenesin.  Otherwise, we discussed symptomatic care but I do not think she needs antibiotic's at this time.  We did discuss strict return precautions.  Chest tightness is almost undoubtedly from the URI symptoms rather than ACS, PE, or dissection.  Final Clinical Impressions(s) / ED Diagnoses   Final diagnoses:  Acute bronchitis, unspecified organism    ED Discharge Orders         Ordered    albuterol (PROVENTIL HFA;VENTOLIN HFA) 108 (90 Base) MCG/ACT inhaler  Every 6 hours PRN     05/31/18 1033    guaiFENesin (ROBITUSSIN) 100 MG/5ML liquid  Every 4 hours PRN     05/31/18 1033           Pricilla Loveless, MD 05/31/18 1040

## 2019-08-12 ENCOUNTER — Other Ambulatory Visit: Payer: Self-pay

## 2019-08-12 ENCOUNTER — Emergency Department (HOSPITAL_BASED_OUTPATIENT_CLINIC_OR_DEPARTMENT_OTHER)
Admission: EM | Admit: 2019-08-12 | Discharge: 2019-08-12 | Disposition: A | Discharge status: Home / Self Care | Payer: Medicare Other | Attending: Emergency Medicine | Admitting: Emergency Medicine

## 2019-08-12 ENCOUNTER — Encounter (HOSPITAL_BASED_OUTPATIENT_CLINIC_OR_DEPARTMENT_OTHER): Payer: Self-pay | Admitting: Emergency Medicine

## 2019-08-12 DIAGNOSIS — Z79899 Other long term (current) drug therapy: Secondary | ICD-10-CM | POA: Insufficient documentation

## 2019-08-12 DIAGNOSIS — N390 Urinary tract infection, site not specified: Secondary | ICD-10-CM | POA: Diagnosis not present

## 2019-08-12 DIAGNOSIS — I10 Essential (primary) hypertension: Secondary | ICD-10-CM | POA: Diagnosis not present

## 2019-08-12 DIAGNOSIS — G35 Multiple sclerosis: Secondary | ICD-10-CM | POA: Insufficient documentation

## 2019-08-12 DIAGNOSIS — R300 Dysuria: Secondary | ICD-10-CM | POA: Diagnosis present

## 2019-08-12 LAB — URINALYSIS, ROUTINE W REFLEX MICROSCOPIC
Bilirubin Urine: NEGATIVE
Glucose, UA: NEGATIVE mg/dL
Ketones, ur: NEGATIVE mg/dL
Nitrite: NEGATIVE
Protein, ur: 100 mg/dL — AB
Specific Gravity, Urine: 1.025 (ref 1.005–1.030)
pH: 6 (ref 5.0–8.0)

## 2019-08-12 LAB — URINALYSIS, MICROSCOPIC (REFLEX)
RBC / HPF: >50 RBC/hpf (ref 0–5)
WBC, UA: >50 WBC/hpf (ref 0–5)

## 2019-08-12 MED ORDER — CEFTRIAXONE SODIUM 1 G IJ SOLR
1.0000 g | Freq: Once | INTRAMUSCULAR | Status: AC
  Administered 2019-08-12: 1 g via INTRAMUSCULAR
  Filled 2019-08-12: qty 10

## 2019-08-12 MED ORDER — PHENAZOPYRIDINE HCL 95 MG PO TABS: 95.0000 mg | ORAL_TABLET | Freq: Three times a day (TID) | ORAL | 0 refills | Status: AC | PRN

## 2019-08-12 MED ORDER — CEPHALEXIN 500 MG PO CAPS: 500.0000 mg | ORAL_CAPSULE | Freq: Three times a day (TID) | ORAL | 0 refills | Status: AC

## 2019-08-12 MED ORDER — KETOROLAC TROMETHAMINE 30 MG/ML IJ SOLN
60.0000 mg | Freq: Once | INTRAMUSCULAR | Status: AC
Start: 2019-08-12 — End: 2019-08-12
  Administered 2019-08-12: 60 mg via INTRAMUSCULAR
  Filled 2019-08-12: qty 2

## 2019-08-12 MED FILL — CEPHALEXIN 500 MG CAPSULE: 500 | 5 days supply | Qty: 15 | Fill #0

## 2019-08-12 MED FILL — SM URINARY PAIN RLF 95 MG T: 95 | 10 days supply | Qty: 30 | Fill #0

## 2019-08-12 NOTE — Discharge Instructions (Signed)
You may alternate Tylenol 1000 mg every 6 hours as needed for pain, fever and Ibuprofen 800 mg every 8 hours as needed for pain, fever.  Please take Ibuprofen with food.  Do not take more than 4000 mg of Tylenol (acetaminophen) in a 24 hour period.  

## 2019-08-12 NOTE — ED Provider Notes (Signed)
TIME SEEN: 6:53 AM  CHIEF COMPLAINT: Dysuria, hematuria  HPI: Patient is a 63 year old female with history of hypertension, multiple sclerosis who presents to the emergency department with lower abdominal discomfort, dysuria, gross hematuria and now urinary incontinence that started yesterday.  States she is just leaking urine and has to pee frequently.  She is currently wearing a depends.  No flank pain, upper abdominal pain, nausea, vomiting or diarrhea.  No fever.  No history of kidney stones.  Her last UTI was years ago.  No vaginal bleeding or abnormal vaginal discharge.  ROS: See HPI Constitutional: no fever  Eyes: no drainage  ENT: no runny nose   Cardiovascular:  no chest pain  Resp: no SOB  GI: no vomiting GU:  dysuria Integumentary: no rash  Allergy: no hives  Musculoskeletal: no leg swelling  Neurological: no slurred speech ROS otherwise negative  PAST MEDICAL HISTORY/PAST SURGICAL HISTORY:  Past Medical History:  Diagnosis Date  . Cataract   . Hypertension   . Multiple sclerosis ([place])     MEDICATIONS:  Prior to Admission medications   Medication Sig Start Date End Date Taking? Authorizing Provider  albuterol (PROVENTIL HFA;VENTOLIN HFA) 108 (90 Base) MCG/ACT inhaler Inhale 1-2 puffs into the lungs every 6 (six) hours as needed for wheezing or shortness of breath. 05/31/18   Sherwood Gambler, MD  diphenoxylate-atropine (LOMOTIL) 2.5-0.025 MG per tablet Take 1 tablet by mouth 4 (four) times daily as needed for diarrhea or loose stools. 01/15/15   Sherwood Gambler, MD  fluticasone (FLONASE) 50 MCG/ACT nasal spray Place 1 spray into both nostrils daily as needed for allergies or rhinitis.    [provider]  gabapentin (NEURONTIN) 300 MG capsule Take 300 mg by mouth 3 (three) times daily as needed (nerve pain).     [provider]  glimepiride (AMARYL) 1 MG tablet Take by mouth.    [provider]  guaiFENesin (ROBITUSSIN) 100 MG/5ML liquid Take 5-10  mLs (100-200 mg total) by mouth every 4 (four) hours as needed for cough. 05/31/18   Sherwood Gambler, MD  ibuprofen (ADVIL,MOTRIN) 800 MG tablet Take 800 mg by mouth every 8 (eight) hours as needed for headache, mild pain, moderate pain or cramping.  06/08/14   [provider]  Interferon Beta-1a (REBIF [place]) Inject 44 mcg into the skin 3 (three) times a week.     [provider]  lisinopril-hydrochlorothiazide (PRINZIDE,ZESTORETIC) 20-25 MG per tablet Take 1 tablet by mouth daily.    [provider]  magnesium 30 MG tablet Take by mouth.    [provider]  potassium chloride (K-DUR,KLOR-CON) 10 MEQ tablet Take 1 tablet by mouth daily. 04/14/14   [provider]  ranitidine (ZANTAC) 150 MG tablet Take 150 mg by mouth 2 (two) times daily.    [provider]  temazepam (RESTORIL) 15 MG capsule Take 15 mg by mouth at bedtime. 01/05/14   [provider]  valACYclovir (VALTREX) 1000 MG tablet Take 2,000 mg by mouth 2 (two) times daily as needed (cold sores).  11/23/14   [provider]    ALLERGIES:  No Known Allergies  SOCIAL HISTORY:  Social History   Tobacco Use  . Smoking status: Never Smoker  . Smokeless tobacco: Never Used  Substance Use Topics  . Alcohol use: No    FAMILY HISTORY: History reviewed. No pertinent family history.  EXAM: BP (!) 145/95 (BP Location: Right Arm)   Pulse 85   Temp 98.4 F (36.9 C) (Oral)  Resp 18   Ht 5\' 4"  (1.626 m)   Wt 104.3 kg   SpO2 100%   BMI 39.48 kg/m  CONSTITUTIONAL: Alert and oriented and responds appropriately to questions.  Patient appears uncomfortable and is tearful.  Not in significant distress.  Afebrile.  Nontoxic-appearing. HEAD: Normocephalic EYES: Conjunctivae clear, pupils appear equal, EOM appear intact ENT: normal nose; moist mucous membranes NECK: Supple, normal ROM CARD: RRR; S1 and S2 appreciated; no murmurs, no clicks, no rubs, no gallops RESP: Normal  chest excursion without splinting or tachypnea; breath sounds clear and equal bilaterally; no wheezes, no rhonchi, no rales, no hypoxia or respiratory distress, speaking full sentences ABD/GI: Normal bowel sounds; non-distended; soft, non-tender, no rebound, no guarding, no peritoneal signs, no hepatosplenomegaly BACK:  The back appears normal EXT: Normal ROM in all joints; no deformity noted, no edema; no cyanosis SKIN: Normal color for age and race; warm; no rash on exposed skin NEURO: Moves all extremities equally PSYCH: The patient's mood and manner are appropriate.   MEDICAL DECISION MAKING: Patient here with likely UTI.  She denies any vaginal symptoms.  Her abdominal exam is benign.  No flank pain, nausea, vomiting, fever to suggest pyelonephritis, kidney stone.  Will obtain urinalysis, urine culture.  Will give Toradol IM for discomfort.  ED PROGRESS: Patient does have a urinary tract infection.  Culture pending.  Will give dose of IM Rocephin here and discharged home on Keflex.  No previous positive urine cultures in our system.  Recommended alternating Tylenol and Motrin for pain and recommended Azo as needed as well.   At this time, I do not feel there is any life-threatening condition present. I have reviewed, interpreted and discussed all results (EKG, imaging, lab, urine as appropriate) and exam findings with patient/family. I have reviewed nursing notes and appropriate previous records.  I feel the patient is safe to be discharged home without further emergent workup and can continue workup as an outpatient as needed. Discussed usual and customary return precautions. Patient/family verbalize understanding and are comfortable with this plan.  Outpatient follow-up has been provided as needed. All questions have been answered.      Kathryn Gross was evaluated in Emergency Department on 08/12/2019 for the symptoms described in the history of present illness. She was evaluated in  the context of the global COVID-19 pandemic, which necessitated consideration that the patient might be at risk for infection with the SARS-CoV-2 virus that causes COVID-19. Institutional protocols and algorithms that pertain to the evaluation of patients at risk for COVID-19 are in a state of rapid change based on information released by regulatory bodies including the CDC and federal and state organizations. These policies and algorithms were followed during the patient's care in the ED.  Patient was seen wearing N95, face shield, gloves.    Ward, 14/10/2018, DO 08/12/19 [phone]

## 2019-08-12 NOTE — ED Triage Notes (Signed)
  Patient comes in with lower abdominal pain that started a day ago.  Patient woke up about 0530 this morning to void and noticed blood in her urine.  Patient states pain is 10/10 and across her lower abdomen.  No hx of kidney stones.  No fevers.  No N/V.

## 2019-08-13 LAB — URINE CULTURE

## 2019-11-11 ENCOUNTER — Other Ambulatory Visit: Payer: Self-pay | Admitting: *Deleted

## 2019-11-11 DIAGNOSIS — G35 Multiple sclerosis: Secondary | ICD-10-CM

## 2019-11-19 ENCOUNTER — Ambulatory Visit (AMBULATORY_SURGERY_CENTER): Payer: Self-pay | Admitting: *Deleted

## 2019-11-19 ENCOUNTER — Other Ambulatory Visit: Payer: Self-pay

## 2019-11-19 VITALS — Temp 96.9°F | Ht 64.0 in | Wt 230.0 lb

## 2019-11-19 DIAGNOSIS — Z1211 Encounter for screening for malignant neoplasm of colon: Secondary | ICD-10-CM

## 2019-11-19 DIAGNOSIS — Z01818 Encounter for other preprocedural examination: Secondary | ICD-10-CM

## 2019-11-19 NOTE — Progress Notes (Signed)

## 2019-11-23 ENCOUNTER — Ambulatory Visit (INDEPENDENT_AMBULATORY_CARE_PROVIDER_SITE_OTHER): Payer: Medicare Other

## 2019-11-23 ENCOUNTER — Other Ambulatory Visit: Payer: Self-pay

## 2019-11-23 DIAGNOSIS — Z1159 Encounter for screening for other viral diseases: Secondary | ICD-10-CM

## 2019-11-23 LAB — SARS CORONAVIRUS 2 (TAT 6-24 HRS): SARS Coronavirus 2: NEGATIVE

## 2019-11-26 ENCOUNTER — Other Ambulatory Visit: Payer: Self-pay

## 2019-11-26 ENCOUNTER — Encounter: Payer: Self-pay | Admitting: Gastroenterology

## 2019-11-26 ENCOUNTER — Ambulatory Visit (AMBULATORY_SURGERY_CENTER): Payer: Medicare Other | Admitting: Gastroenterology

## 2019-11-26 VITALS — BP 119/74 | HR 63 | Temp 97.1°F | Resp 13 | Ht 64.0 in | Wt 230.0 lb

## 2019-11-26 DIAGNOSIS — K5730 Diverticulosis of large intestine without perforation or abscess without bleeding: Secondary | ICD-10-CM

## 2019-11-26 DIAGNOSIS — K640 First degree hemorrhoids: Secondary | ICD-10-CM

## 2019-11-26 DIAGNOSIS — Z1211 Encounter for screening for malignant neoplasm of colon: Secondary | ICD-10-CM | POA: Diagnosis not present

## 2019-11-26 MED ORDER — SODIUM CHLORIDE 0.9 % IV SOLN: 500.0000 mL | INTRAVENOUS | Status: DC

## 2019-11-26 NOTE — Progress Notes (Signed)
A/ox3, pleased with MAC, report to RN 

## 2019-11-26 NOTE — Progress Notes (Signed)
I have reviewed the patient's medical history in detail and updated the computerized patient record. Temp LC V/s CW 

## 2019-11-26 NOTE — Op Note (Signed)
[facility] Patient Name: Heli Dino Procedure Date: 11/26/2019 10:22 AM MRN: [id number] Endoscopist: Doristine Locks , MD Age: 64 Referring MD:  Date of Birth: [birth year] Gender: Female Account #: [id number] Procedure:                Colonoscopy Indications:              Screening for colorectal malignant neoplasm (last                            colonoscopy was 10 years ago). Otherwise, no GI                            symptoms and no known FHx of CRC. Medicines:                Monitored Anesthesia Care Procedure:                Pre-Anesthesia Assessment:                           - Prior to the procedure, a History and Physical                            was performed, and patient medications and                            allergies were reviewed. The patient's tolerance of                            previous anesthesia was also reviewed. The risks                            and benefits of the procedure and the sedation                            options and risks were discussed with the patient.                            All questions were answered, and informed consent                            was obtained. Prior Anticoagulants: The patient has                            taken no previous anticoagulant or antiplatelet                            agents. ASA Grade Assessment: II - A patient with                            mild systemic disease. After reviewing the risks                            and benefits, the patient was deemed in  satisfactory condition to undergo the procedure.                           After obtaining informed consent, the colonoscope                            was passed under direct vision. Throughout the                            procedure, the patient's blood pressure, pulse, and                            oxygen saturations were monitored continuously. The                            Colonoscope was  introduced through the anus and                            advanced to the the terminal ileum. The colonoscopy                            was performed without difficulty. The patient                            tolerated the procedure well. The quality of the                            bowel preparation was adequate. The terminal ileum,                            ileocecal valve, appendiceal orifice, and rectum                            were photographed. Scope In: 10:35:04 AM Scope Out: 10:50:12 AM Scope Withdrawal Time: 0 hours 11 minutes 33 seconds  Total Procedure Duration: 0 hours 15 minutes 8 seconds  Findings:                 The perianal and digital rectal examinations were                            normal.                           A few small-mouthed diverticula were found in the                            sigmoid colon.                           Non-bleeding internal hemorrhoids were found during                            retroflexion. The hemorrhoids were small.  The exam was otherwise normal throughout the                            remainder of the colon.                           The terminal ileum appeared normal. Complications:            No immediate complications. Estimated Blood Loss:     Estimated blood loss: none. Impression:               - Diverticulosis in the sigmoid colon.                           - Non-bleeding internal hemorrhoids.                           - The examined portion of the ileum was normal.                           - No specimens collected. Recommendation:           - Patient has a contact number available for                            emergencies. The signs and symptoms of potential                            delayed complications were discussed with the                            patient. Return to normal activities tomorrow.                            Written discharge instructions were provided to the                             patient.                           - Resume previous diet.                           - Continue present medications.                           - Repeat colonoscopy in 10 years for screening                            purposes.                           - Return to GI office PRN.                           - Use fiber, for example Citrucel, Fibercon, [employer]  or Metamucil. Doristine Locks, MD 11/26/2019 10:56:35 AM

## 2019-11-26 NOTE — Patient Instructions (Signed)
Discharge instructions given. Handouts on Diverticulosis and Hemorrhoids. Resume previous medications. Use fiber, for example Citrucel,Fibercon,Konsyl or Metamucil. YOU HAD AN ENDOSCOPIC PROCEDURE TODAY AT THE [facility]:   Refer to the procedure report that was given to you for any specific questions about what was found during the examination.  If the procedure report does not answer your questions, please call your gastroenterologist to clarify.  If you requested that your care partner not be given the details of your procedure findings, then the procedure report has been included in a sealed envelope for you to review at your convenience later.  YOU SHOULD EXPECT: Some feelings of bloating in the abdomen. Passage of more gas than usual.  Walking can help get rid of the air that was put into your GI tract during the procedure and reduce the bloating. If you had a lower endoscopy (such as a colonoscopy or flexible sigmoidoscopy) you may notice spotting of blood in your stool or on the toilet paper. If you underwent a bowel prep for your procedure, you may not have a normal bowel movement for a few days.  Please Note:  You might notice some irritation and congestion in your nose or some drainage.  This is from the oxygen used during your procedure.  There is no need for concern and it should clear up in a day or so.  SYMPTOMS TO REPORT IMMEDIATELY:   Following lower endoscopy (colonoscopy or flexible sigmoidoscopy):  Excessive amounts of blood in the stool  Significant tenderness or worsening of abdominal pains  Swelling of the abdomen that is new, acute  Fever of 100F or higher   For urgent or emergent issues, a gastroenterologist can be reached at any hour by calling [phone]. Do not use MyChart messaging for urgent concerns.    DIET:  We do recommend a small meal at first, but then you may proceed to your regular diet.  Drink plenty of fluids but you should avoid  alcoholic beverages for 24 hours.  ACTIVITY:  You should plan to take it easy for the rest of today and you should NOT DRIVE or use heavy machinery until tomorrow (because of the sedation medicines used during the test).    FOLLOW UP: Our staff will call the number listed on your records 48-72 hours following your procedure to check on you and address any questions or concerns that you may have regarding the information given to you following your procedure. If we do not reach you, we will leave a message.  We will attempt to reach you two times.  During this call, we will ask if you have developed any symptoms of COVID 19. If you develop any symptoms (ie: fever, flu-like symptoms, shortness of breath, cough etc.) before then, please call [phone].  If you test positive for Covid 19 in the 2 weeks post procedure, please call and report this information to [place].    If any biopsies were taken you will be contacted by phone or by letter within the next 1-3 weeks.  Please call us at [phone] if you have not heard about the biopsies in 3 weeks.    SIGNATURES/CONFIDENTIALITY: You and/or your care partner have signed paperwork which will be entered into your electronic medical record.  These signatures attest to the fact that that the information above on your After Visit Summary has been reviewed and is understood.  Full responsibility of the confidentiality of this discharge information lies with you and/or your care-partner.

## 2019-11-30 ENCOUNTER — Telehealth: Payer: Self-pay | Admitting: *Deleted

## 2019-11-30 NOTE — Telephone Encounter (Signed)
  Follow up Call-  Call back number 11/26/2019  Post procedure Call Back phone  # [phone]  Permission to leave phone message Yes  Some recent data might be hidden     Patient questions:  Do you have a fever, pain , or abdominal swelling? No. Pain Score  0 *  Have you tolerated food without any problems? Yes.    Have you been able to return to your normal activities? Yes.    Do you have any questions about your discharge instructions: Diet   No. Medications  No. Follow up visit  No.  Do you have questions or concerns about your Care? No.  Actions: * If pain score is 4 or above: No action needed, pain <4.   1. Have you developed a fever since your procedure? no  2.   Have you had an respiratory symptoms (SOB or cough) since your procedure? no  3.   Have you tested positive for COVID 19 since your procedure no  4.   Have you had any family members/close contacts diagnosed with the COVID 19 since your procedure? no   If yes to any of these questions please route to Laverna Peace, RN and Jennye Boroughs, Charity fundraiser.

## 2019-12-03 ENCOUNTER — Other Ambulatory Visit: Payer: Self-pay

## 2019-12-03 ENCOUNTER — Ambulatory Visit
Admission: RE | Admit: 2019-12-03 | Discharge: 2019-12-03 | Disposition: A | Discharge status: Home / Self Care | Payer: Medicare Other | Source: Ambulatory Visit | Attending: *Deleted | Admitting: *Deleted

## 2019-12-03 DIAGNOSIS — G35 Multiple sclerosis: Secondary | ICD-10-CM

## 2019-12-17 ENCOUNTER — Other Ambulatory Visit: Payer: Self-pay | Admitting: *Deleted

## 2019-12-17 DIAGNOSIS — M25519 Pain in unspecified shoulder: Secondary | ICD-10-CM

## 2019-12-23 ENCOUNTER — Other Ambulatory Visit: Payer: Self-pay

## 2019-12-23 ENCOUNTER — Ambulatory Visit
Admission: RE | Admit: 2019-12-23 | Discharge: 2019-12-23 | Disposition: A | Discharge status: Home / Self Care | Payer: Medicare Other | Source: Ambulatory Visit | Attending: *Deleted | Admitting: *Deleted

## 2019-12-23 DIAGNOSIS — M25519 Pain in unspecified shoulder: Secondary | ICD-10-CM

## 2020-01-09 ENCOUNTER — Other Ambulatory Visit: Payer: Medicare Other

## 2020-03-16 ENCOUNTER — Ambulatory Visit (INDEPENDENT_AMBULATORY_CARE_PROVIDER_SITE_OTHER): Payer: Medicare Other | Admitting: Psychologist

## 2020-03-16 DIAGNOSIS — F320 Major depressive disorder, single episode, mild: Secondary | ICD-10-CM | POA: Diagnosis not present

## 2020-03-16 DIAGNOSIS — Z630 Problems in relationship with spouse or partner: Secondary | ICD-10-CM

## 2020-03-23 ENCOUNTER — Ambulatory Visit: Payer: Medicare Other | Admitting: Psychologist

## 2020-04-06 ENCOUNTER — Ambulatory Visit (INDEPENDENT_AMBULATORY_CARE_PROVIDER_SITE_OTHER): Payer: Medicare Other | Admitting: Psychologist

## 2020-04-06 DIAGNOSIS — F320 Major depressive disorder, single episode, mild: Secondary | ICD-10-CM

## 2020-04-06 DIAGNOSIS — Z630 Problems in relationship with spouse or partner: Secondary | ICD-10-CM | POA: Diagnosis not present

## 2020-04-14 ENCOUNTER — Ambulatory Visit (INDEPENDENT_AMBULATORY_CARE_PROVIDER_SITE_OTHER): Payer: Medicare Other | Admitting: Psychologist

## 2020-04-14 DIAGNOSIS — Z630 Problems in relationship with spouse or partner: Secondary | ICD-10-CM | POA: Diagnosis not present

## 2020-04-14 DIAGNOSIS — F320 Major depressive disorder, single episode, mild: Secondary | ICD-10-CM | POA: Diagnosis not present

## 2020-04-26 ENCOUNTER — Ambulatory Visit (INDEPENDENT_AMBULATORY_CARE_PROVIDER_SITE_OTHER): Payer: Medicare Other | Admitting: Psychologist

## 2020-04-26 DIAGNOSIS — Z630 Problems in relationship with spouse or partner: Secondary | ICD-10-CM

## 2020-04-26 DIAGNOSIS — F320 Major depressive disorder, single episode, mild: Secondary | ICD-10-CM

## 2020-05-09 ENCOUNTER — Ambulatory Visit: Payer: Medicare Other | Admitting: Psychologist

## 2020-05-10 ENCOUNTER — Ambulatory Visit (INDEPENDENT_AMBULATORY_CARE_PROVIDER_SITE_OTHER): Payer: Medicare Other | Admitting: Psychologist

## 2020-05-10 DIAGNOSIS — Z630 Problems in relationship with spouse or partner: Secondary | ICD-10-CM | POA: Diagnosis not present

## 2020-05-10 DIAGNOSIS — F320 Major depressive disorder, single episode, mild: Secondary | ICD-10-CM | POA: Diagnosis not present

## 2020-06-06 ENCOUNTER — Ambulatory Visit: Payer: Medicare Other | Admitting: Psychologist

## 2020-09-28 ENCOUNTER — Emergency Department (HOSPITAL_BASED_OUTPATIENT_CLINIC_OR_DEPARTMENT_OTHER): Payer: Medicare Other

## 2020-09-28 ENCOUNTER — Other Ambulatory Visit: Payer: Self-pay

## 2020-09-28 ENCOUNTER — Emergency Department (HOSPITAL_BASED_OUTPATIENT_CLINIC_OR_DEPARTMENT_OTHER)
Admission: EM | Admit: 2020-09-28 | Discharge: 2020-09-28 | Disposition: A | Discharge status: Home / Self Care | Payer: Medicare Other | Attending: Emergency Medicine | Admitting: Emergency Medicine

## 2020-09-28 ENCOUNTER — Encounter (HOSPITAL_BASED_OUTPATIENT_CLINIC_OR_DEPARTMENT_OTHER): Payer: Self-pay

## 2020-09-28 DIAGNOSIS — E119 Type 2 diabetes mellitus without complications: Secondary | ICD-10-CM | POA: Insufficient documentation

## 2020-09-28 DIAGNOSIS — Y9301 Activity, walking, marching and hiking: Secondary | ICD-10-CM | POA: Diagnosis not present

## 2020-09-28 DIAGNOSIS — Z79899 Other long term (current) drug therapy: Secondary | ICD-10-CM | POA: Diagnosis not present

## 2020-09-28 DIAGNOSIS — S93401A Sprain of unspecified ligament of right ankle, initial encounter: Secondary | ICD-10-CM | POA: Insufficient documentation

## 2020-09-28 DIAGNOSIS — I10 Essential (primary) hypertension: Secondary | ICD-10-CM | POA: Insufficient documentation

## 2020-09-28 DIAGNOSIS — R1031 Right lower quadrant pain: Secondary | ICD-10-CM | POA: Diagnosis not present

## 2020-09-28 DIAGNOSIS — X501XXA Overexertion from prolonged static or awkward postures, initial encounter: Secondary | ICD-10-CM | POA: Diagnosis not present

## 2020-09-28 DIAGNOSIS — Z7984 Long term (current) use of oral hypoglycemic drugs: Secondary | ICD-10-CM | POA: Insufficient documentation

## 2020-09-28 DIAGNOSIS — S99911A Unspecified injury of right ankle, initial encounter: Secondary | ICD-10-CM | POA: Diagnosis present

## 2020-09-28 DIAGNOSIS — Z20822 Contact with and (suspected) exposure to covid-19: Secondary | ICD-10-CM | POA: Diagnosis not present

## 2020-09-28 DIAGNOSIS — K219 Gastro-esophageal reflux disease without esophagitis: Secondary | ICD-10-CM | POA: Diagnosis not present

## 2020-09-28 LAB — URINALYSIS, MICROSCOPIC (REFLEX)

## 2020-09-28 LAB — CBC
HCT: 33.1 % — ABNORMAL LOW (ref 36.0–46.0)
Hemoglobin: 10.6 g/dL — ABNORMAL LOW (ref 12.0–15.0)
MCH: 30.3 pg (ref 26.0–34.0)
MCHC: 32 g/dL (ref 30.0–36.0)
MCV: 94.6 fL (ref 80.0–100.0)
Platelets: 304 10*3/uL (ref 150–400)
RBC: 3.5 MIL/uL — ABNORMAL LOW (ref 3.87–5.11)
RDW: 14.5 % (ref 11.5–15.5)
WBC: 8.9 10*3/uL (ref 4.0–10.5)
nRBC: 0 % (ref 0.0–0.2)

## 2020-09-28 LAB — URINALYSIS, ROUTINE W REFLEX MICROSCOPIC
Bilirubin Urine: NEGATIVE
Glucose, UA: NEGATIVE mg/dL
Hgb urine dipstick: NEGATIVE
Ketones, ur: NEGATIVE mg/dL
Nitrite: NEGATIVE
Protein, ur: NEGATIVE mg/dL
Specific Gravity, Urine: 1.025 (ref 1.005–1.030)
pH: 5 (ref 5.0–8.0)

## 2020-09-28 LAB — BASIC METABOLIC PANEL
Anion gap: 10 (ref 5–15)
BUN: 18 mg/dL (ref 8–23)
CO2: 23 mmol/L (ref 22–32)
Calcium: 9.2 mg/dL (ref 8.9–10.3)
Chloride: 106 mmol/L (ref 98–111)
Creatinine, Ser: 0.95 mg/dL (ref 0.44–1.00)
GFR, Estimated: >60 mL/min (ref >60)
Glucose, Bld: 194 mg/dL — ABNORMAL HIGH (ref 70–99)
Potassium: 3.7 mmol/L (ref 3.5–5.1)
Sodium: 139 mmol/L (ref 135–145)

## 2020-09-28 LAB — SARS CORONAVIRUS 2 (TAT 6-24 HRS): SARS Coronavirus 2: NEGATIVE

## 2020-09-28 MED ORDER — POLYETHYLENE GLYCOL 3350 17 G PO PACK: 17.0000 g | PACK | Freq: Every day | ORAL | 0 refills | Status: AC | PRN

## 2020-09-28 MED ORDER — IOHEXOL 300 MG/ML  SOLN
100.0000 mL | Freq: Once | INTRAMUSCULAR | Status: AC | PRN
  Administered 2020-09-28: 100 mL via INTRAVENOUS

## 2020-09-28 NOTE — Discharge Instructions (Signed)
Your urine showed a possible infection.  A culture was sent.  You will be notified if it is positive.  Watch for worsening abdominal pain.  You have been given a short course of stool softeners to see if that will help with the abdominal pain.  Follow-up with your doctor as needed.

## 2020-09-28 NOTE — ED Provider Notes (Signed)
[facility] EMERGENCY DEPARTMENT Provider Note   CSN: [id number] Arrival date & time: 09/28/20  1047     History Chief Complaint  Patient presents with  . Abdominal Pain    Kathryn Gross is a 65 y.o. female.  HPI Patient presents with right lower quadrant abdominal pain.  Abdominal pain is dull.  Worse with movements.  No nausea vomiting or diarrhea.  Has had for around the last 3 to 4 days.  Somewhat diffuse but then more localized.  States she feels bad.  Seen at urgent care and told to come to the ER to rule out appendicitis.  Also states that she feels a little weak all over.  States that she was walking her right ankle gave out and she now has pain in the right ankle.  No other injury from the fall.    Past Medical History:  Diagnosis Date  . Allergy   . Anemia    past hx   . Arthritis    right shoulder  . Cataract    removed both eyes   . Diabetes mellitus without complication (HCC)    on amaryl- pre DM per pt   . GERD (gastroesophageal reflux disease)   . Hypertension   . Multiple sclerosis (HCC)   . Neuromuscular disorder (HCC)    MS   . Sleep apnea    uses cpap     There are no problems to display for this patient.   Past Surgical History:  Procedure Laterality Date  . c sections     x3  . CATARACT EXTRACTION, BILATERAL    . COLONOSCOPY       OB History   No obstetric history on file.     Family History  Problem Relation Age of Onset  . Colon cancer Neg Hx   . Colon polyps Neg Hx   . Esophageal cancer Neg Hx   . Rectal cancer Neg Hx   . Stomach cancer Neg Hx     Social History   Tobacco Use  . Smoking status: Never Smoker  . Smokeless tobacco: Never Used  Substance Use Topics  . Alcohol use: No  . Drug use: No    Home Medications Prior to Admission medications   Medication Sig Start Date End Date Taking? Authorizing Provider  polyethylene glycol (MIRALAX / GLYCOLAX) 17 g packet Take 17 g by mouth daily as needed  for mild constipation. 09/28/20  Yes Benjiman Core, MD  ACCU-CHEK GUIDE test strip  11/04/19   [provider]  albuterol (PROVENTIL HFA;VENTOLIN HFA) 108 (90 Base) MCG/ACT inhaler Inhale 1-2 puffs into the lungs every 6 (six) hours as needed for wheezing or shortness of breath. Patient not taking: Reported on 11/26/2019 05/31/18   Pricilla Loveless, MD  amphetamine-dextroamphetamine (ADDERALL) 20 MG tablet  05/06/19   [provider]  benzonatate (TESSALON) 100 MG capsule Take 200 mg by mouth 3 (three) times daily as needed. 07/25/19   [provider]  Blood Glucose Monitoring Suppl (GLUCOCOM BLOOD GLUCOSE MONITOR) DEVI FSBS DAILY 05/11/15   [provider]  Blood Glucose Monitoring Suppl (GLUCOCOM BLOOD GLUCOSE MONITOR) DEVI FSBS DAILY 05/11/15   [provider]  cephALEXin (KEFLEX) 500 MG capsule Take 1 capsule (500 mg total) by mouth 3 (three) times daily. Patient not taking: Reported on 11/26/2019 08/12/19   Ward, Layla Maw, DO  diphenoxylate-atropine (LOMOTIL) 2.5-0.025 MG per tablet Take 1 tablet by mouth 4 (four) times daily as needed for diarrhea or  loose stools. Patient not taking: Reported on 11/26/2019 01/15/15   Pricilla Loveless, MD  famotidine (PEPCID) 20 MG tablet Take 20 mg by mouth 2 (two) times daily. 11/04/19   [provider]  fluticasone (FLONASE) 50 MCG/ACT nasal spray Place 1 spray into both nostrils daily as needed for allergies or rhinitis.    [provider]  gabapentin (NEURONTIN) 300 MG capsule Take 300 mg by mouth 3 (three) times daily as needed (nerve pain).     [provider]  glimepiride (AMARYL) 1 MG tablet Take by mouth.    [provider]  Glucose Blood (COOL BLOOD GLUCOSE TEST STRIPS VI) USE TO CHECK BLOOD SUGAR DAILY 01/26/19   [provider]  guaiFENesin (ROBITUSSIN) 100 MG/5ML liquid Take 5-10 mLs (100-200 mg total) by mouth every 4 (four) hours as needed for cough. Patient not  taking: Reported on 11/26/2019 05/31/18   Pricilla Loveless, MD  HYDROcodone-acetaminophen (NORCO/VICODIN) 5-325 MG tablet  10/10/16   [provider]  ibuprofen (ADVIL,MOTRIN) 800 MG tablet Take 800 mg by mouth every 8 (eight) hours as needed for headache, mild pain, moderate pain or cramping.  06/08/14   [provider]  Interferon Beta-1a (REBIF [place]) Inject 44 mcg into the skin 3 (three) times a week.     [provider]  Lancets (ACCU-CHEK SOFT TOUCH) lancets by Does not apply route. 02/01/17   [provider]  lisinopril-hydrochlorothiazide (PRINZIDE,ZESTORETIC) 20-25 MG per tablet Take 1 tablet by mouth daily.    [provider]  loratadine-pseudoephedrine (CLARITIN-D 12-HOUR) 5-120 MG tablet Take by mouth as needed.  08/21/16   [provider]  magnesium 30 MG tablet Take by mouth.    [provider]  meclizine (ANTIVERT) 25 MG tablet Take by mouth 2 (two) times daily as needed.  01/16/16   [provider]  meloxicam (MOBIC) 7.5 MG tablet Take 7.5 mg by mouth 2 (two) times daily. 11/09/19   [provider]  montelukast (SINGULAIR) 10 MG tablet Take by mouth as needed.  07/08/15   [provider]  Multiple Vitamin (QUINTABS) TABS Take by mouth. Pre natal vitamin 05/26/18   [provider]  pantoprazole (PROTONIX) 40 MG tablet Take 40 mg by mouth daily. 10/09/19   [provider]  phenazopyridine (PYRIDIUM) 95 MG tablet Take 1 tablet (95 mg total) by mouth 3 (three) times daily as needed for pain. Patient not taking: Reported on 11/26/2019 08/12/19   Ward, Layla Maw, DO  potassium chloride (K-DUR,KLOR-CON) 10 MEQ tablet Take 1 tablet by mouth daily. 04/14/14   [provider]  PREVIDENT 5000 BOOSTER PLUS 1.1 % PSTE  10/02/19   [provider]  promethazine (PHENERGAN) 25 MG tablet Take 25 mg by mouth every 8 (eight) hours as needed. 11/04/19   [provider]  ranitidine (ZANTAC)  150 MG tablet Take 150 mg by mouth 2 (two) times daily.    [provider]  temazepam (RESTORIL) 15 MG capsule Take 15 mg by mouth at bedtime. 01/05/14   [provider]  valACYclovir (VALTREX) 1000 MG tablet Take 2,000 mg by mouth 2 (two) times daily as needed (cold sores).  11/23/14   [provider]    Allergies    Patient has no known allergies.  Review of Systems   Review of Systems  Constitutional: Positive for appetite change and fatigue.  HENT: Negative for congestion.   Respiratory: Negative for shortness of breath.   Cardiovascular: Negative for chest pain.  Gastrointestinal: Positive for abdominal pain.  Endocrine: Negative for polyuria.  Genitourinary: Negative for dysuria and flank pain.  Musculoskeletal:       Right ankle pain.  Skin: Negative for rash.  Neurological: Negative for weakness.  Psychiatric/Behavioral: Negative for confusion.    Physical Exam Updated Vital Signs BP 138/71 (BP Location: Right Arm)   Pulse 70   Temp 99 F (37.2 C) (Oral)   Resp 16   Ht 5\' 4"  (1.626 m)   Wt 104.3 kg   SpO2 100%   BMI 39.47 kg/m   Physical Exam Vitals and nursing note reviewed.  Constitutional:      Appearance: She is well-developed.  HENT:     Head: Normocephalic.  Eyes:     General: No scleral icterus.    Pupils: Pupils are equal, round, and reactive to light.  Cardiovascular:     Rate and Rhythm: Normal rate and regular rhythm.  Pulmonary:     Breath sounds: No wheezing or rhonchi.  Chest:     Chest wall: No tenderness.  Abdominal:     Tenderness: There is abdominal tenderness.     Comments: Right lower quadrant tenderness without rebound or guarding.  No hernias palpated.  Skin:    General: Skin is warm.     Capillary Refill: Capillary refill takes less than 2 seconds.  Neurological:     Mental Status: She is alert and oriented to person, place, and time.  Psychiatric:        Mood and Affect: Mood normal.   Tenderness  to right lateral malleolus of ankle.  No deformity.  Also mild tenderness over the midfoot in this area.  ED Results / Procedures / Treatments   Labs (all labs ordered are listed, but only abnormal results are displayed) Labs Reviewed  CBC - Abnormal; Notable for the following components:      Result Value   RBC 3.50 (*)    Hemoglobin 10.6 (*)    HCT 33.1 (*)    All other components within normal limits  URINALYSIS, ROUTINE W REFLEX MICROSCOPIC - Abnormal; Notable for the following components:   APPearance HAZY (*)    Leukocytes,Ua SMALL (*)    All other components within normal limits  BASIC METABOLIC PANEL - Abnormal; Notable for the following components:   Glucose, Bld 194 (*)    All other components within normal limits  URINALYSIS, MICROSCOPIC (REFLEX) - Abnormal; Notable for the following components:   Bacteria, UA FEW (*)    All other components within normal limits  SARS CORONAVIRUS 2 (TAT 6-24 HRS)  URINE CULTURE    EKG None  Radiology DG Ankle Complete Right  Result Date: 09/28/2020 CLINICAL DATA:  Pain following fall EXAM: RIGHT ANKLE - COMPLETE 3+ VIEW COMPARISON:  None. FINDINGS: Frontal, oblique, and lateral views were obtained. There is subtle calcification medial to the medial malleolus which may represent an age uncertain avulsion. No other findings suggesting fracture. No joint effusion. There is no appreciable joint space narrowing or erosion. There are posterior and inferior calcaneal spurs. Ankle mortise appears intact. IMPRESSION: Apparent age uncertain avulsion along the medial malleolus. No other findings suggesting fracture. No joint effusion. No evident arthropathy. There are calcaneal spurs. Ankle mortise appears intact. Electronically Signed   By: 09/30/2020 III M.D.   On: 09/28/2020 13:48   CT ABDOMEN PELVIS W CONTRAST  Result Date: 09/28/2020 CLINICAL DATA:  Right lower quadrant abdominal pain. Rule out appendicitis. EXAM: CT ABDOMEN AND PELVIS  WITH CONTRAST TECHNIQUE: Multidetector CT imaging of the abdomen and pelvis was performed using the standard protocol following bolus administration of intravenous contrast. CONTRAST:  OMNIPAQUE IOHEXOL 300 MG/ML  SOLN COMPARISON:  None. FINDINGS: Lower chest: No acute abnormality. Hepatobiliary: No focal liver abnormality is seen. No gallstones, gallbladder wall thickening, or biliary dilatation. Pancreas: Unremarkable. No pancreatic ductal dilatation or surrounding inflammatory changes. Spleen: Normal in size without focal abnormality. Adrenals/Urinary Tract: Normal appearance of the adrenal glands. Simple appearing cyst within the anterior cortex of the right upper pole measures 2.6 x 2.3 cm, image 30/2. No hydronephrosis or mass identified bilaterally. Bladder is unremarkable. Stomach/Bowel: The visualized portions of the appendix appear normal. No secondary signs of acute appendicitis identified. No bowel wall thickening, inflammation, or distension. Distal colonic diverticula noted without signs of acute diverticulitis. Vascular/Lymphatic: Normal appearance of the abdominal aorta. No aneurysm. No abdominopelvic adenopathy. Reproductive: There are 2 calcified uterine fibroids identified measuring up to 3.2 cm. Simple appearing cyst within the left ovary/adnexa measures 4.9 x 3.6 cm, image 58/2. Other: No free fluid or fluid collections within the abdomen or pelvis. Musculoskeletal: Degenerative disc disease noted at L5-S1. Bilateral L4-5 and L5-S1 facet arthropathy. IMPRESSION: 1. No acute findings identified within the abdomen or pelvis. The visualized portions of the appendix appear normal. 2. Simple appearing cyst within the left ovary/adnexa measures 4.9 x 3.6 cm. Recommend follow-up US in 6-12 months. Note: This recommendation does not apply to premenarchal patients and to those with increased risk (genetic, family history, elevated tumor markers or other high-risk factors) of ovarian cancer.  Reference: JACR 2020 Feb; 17(2):248-254 3. Calcified uterine fibroids. Electronically Signed   By: Signa Kell M.D.   On: 09/28/2020 13:48    Procedures Procedures (including critical care time)  Medications Ordered in ED Medications  iohexol (OMNIPAQUE) 300 MG/ML solution 100 mL (100 mLs Intravenous Contrast Given 09/28/20 1335)    ED Course  I have reviewed the triage vital signs and the nursing notes.  Pertinent labs & imaging results that were available during my care of the patient were reviewed by me and considered in my medical decision making (see chart for details).    MDM Rules/Calculators/A&P                          Patient with abdominal pain.  Sent from urgent care.  Recently treated for UTI.  White count mildly elevated.  CT scan done however did not show appendicitis.  Also cyst on left ovary which may need to be followed.  I think however the ovarian cyst is not the cause of this.  Has no dysuria but had recent infection.  Does have some abnormalities in the urine.    Culture sent in the culture comes back positive we will treat with antibiotics.  Will give stool softeners and see if that helps.  Discharge home with outpatient follow-up as needed. Final Clinical Impression(s) / ED Diagnoses Final diagnoses:  Right lower quadrant abdominal pain  Sprain of right ankle, unspecified ligament, initial encounter    Rx / DC Orders ED Discharge Orders         Ordered    polyethylene glycol (MIRALAX / GLYCOLAX) 17 g packet  Daily PRN        09/28/20 1448           Benjiman Core, MD 09/28/20 [phone]

## 2020-09-28 NOTE — ED Triage Notes (Signed)
Pt reports RLQ abd pain x 3-4 days. Pt seen at pcp office [facility]) and sent directly here to r/o appendicitis.

## 2020-09-29 LAB — URINE CULTURE

## 2021-03-13 ENCOUNTER — Emergency Department (HOSPITAL_COMMUNITY)
Admission: EM | Admit: 2021-03-13 | Discharge: 2021-03-14 | Disposition: A | Discharge status: Home / Self Care | Payer: Medicare Other | Attending: Emergency Medicine | Admitting: Emergency Medicine

## 2021-03-13 ENCOUNTER — Encounter (HOSPITAL_COMMUNITY): Payer: Self-pay | Admitting: Emergency Medicine

## 2021-03-13 ENCOUNTER — Other Ambulatory Visit: Payer: Self-pay

## 2021-03-13 DIAGNOSIS — Z7984 Long term (current) use of oral hypoglycemic drugs: Secondary | ICD-10-CM | POA: Diagnosis not present

## 2021-03-13 DIAGNOSIS — E1165 Type 2 diabetes mellitus with hyperglycemia: Secondary | ICD-10-CM | POA: Insufficient documentation

## 2021-03-13 DIAGNOSIS — Z79899 Other long term (current) drug therapy: Secondary | ICD-10-CM | POA: Insufficient documentation

## 2021-03-13 DIAGNOSIS — I10 Essential (primary) hypertension: Secondary | ICD-10-CM | POA: Insufficient documentation

## 2021-03-13 DIAGNOSIS — E860 Dehydration: Secondary | ICD-10-CM | POA: Insufficient documentation

## 2021-03-13 DIAGNOSIS — R5383 Other fatigue: Secondary | ICD-10-CM | POA: Diagnosis present

## 2021-03-13 DIAGNOSIS — R739 Hyperglycemia, unspecified: Secondary | ICD-10-CM

## 2021-03-13 DIAGNOSIS — E876 Hypokalemia: Secondary | ICD-10-CM | POA: Insufficient documentation

## 2021-03-13 LAB — BASIC METABOLIC PANEL
Anion gap: 13 (ref 5–15)
BUN: 38 mg/dL — ABNORMAL HIGH (ref 8–23)
CO2: 21 mmol/L — ABNORMAL LOW (ref 22–32)
Calcium: 9.3 mg/dL (ref 8.9–10.3)
Chloride: 101 mmol/L (ref 98–111)
Creatinine, Ser: 1.27 mg/dL — ABNORMAL HIGH (ref 0.44–1.00)
GFR, Estimated: 48 mL/min — ABNORMAL LOW (ref >60)
Glucose, Bld: 503 mg/dL (ref 70–99)
Potassium: 3.4 mmol/L — ABNORMAL LOW (ref 3.5–5.1)
Sodium: 135 mmol/L (ref 135–145)

## 2021-03-13 LAB — CBC WITH DIFFERENTIAL/PLATELET
Abs Immature Granulocytes: 0.51 10*3/uL — ABNORMAL HIGH (ref 0.00–0.07)
Basophils Absolute: 0 10*3/uL (ref 0.0–0.1)
Basophils Relative: 0 %
Eosinophils Absolute: 0 10*3/uL (ref 0.0–0.5)
Eosinophils Relative: 0 %
HCT: 31.4 % — ABNORMAL LOW (ref 36.0–46.0)
Hemoglobin: 10.4 g/dL — ABNORMAL LOW (ref 12.0–15.0)
Immature Granulocytes: 3 %
Lymphocytes Relative: 16 %
Lymphs Abs: 2.5 10*3/uL (ref 0.7–4.0)
MCH: 30.3 pg (ref 26.0–34.0)
MCHC: 33.1 g/dL (ref 30.0–36.0)
MCV: 91.5 fL (ref 80.0–100.0)
Monocytes Absolute: 0.5 10*3/uL (ref 0.1–1.0)
Monocytes Relative: 3 %
Neutro Abs: 12.3 10*3/uL — ABNORMAL HIGH (ref 1.7–7.7)
Neutrophils Relative %: 78 %
Platelets: 315 10*3/uL (ref 150–400)
RBC: 3.43 MIL/uL — ABNORMAL LOW (ref 3.87–5.11)
RDW: 14.2 % (ref 11.5–15.5)
WBC: 15.9 10*3/uL — ABNORMAL HIGH (ref 4.0–10.5)
nRBC: 0 % (ref 0.0–0.2)

## 2021-03-13 LAB — CBG MONITORING, ED
Glucose-Capillary: 338 mg/dL — ABNORMAL HIGH (ref 70–99)
Glucose-Capillary: 373 mg/dL — ABNORMAL HIGH (ref 70–99)
Glucose-Capillary: 389 mg/dL — ABNORMAL HIGH (ref 70–99)
Glucose-Capillary: 512 mg/dL (ref 70–99)

## 2021-03-13 MED ORDER — POTASSIUM CHLORIDE CRYS ER 20 MEQ PO TBCR
40.0000 meq | EXTENDED_RELEASE_TABLET | Freq: Two times a day (BID) | ORAL | Status: DC
  Administered 2021-03-13: 40 meq via ORAL
  Filled 2021-03-13: qty 2

## 2021-03-13 MED ORDER — INSULIN ASPART 100 UNIT/ML IJ SOLN
10.0000 [IU] | Freq: Once | INTRAMUSCULAR | Status: AC
  Administered 2021-03-13: 10 [IU] via SUBCUTANEOUS
  Filled 2021-03-13: qty 0.1

## 2021-03-13 MED ORDER — INSULIN ASPART 100 UNIT/ML IJ SOLN
8.0000 [IU] | Freq: Once | INTRAMUSCULAR | Status: AC
  Administered 2021-03-13: 8 [IU] via SUBCUTANEOUS
  Filled 2021-03-13: qty 0.08

## 2021-03-13 MED ORDER — SODIUM CHLORIDE 0.9 % IV BOLUS
500.0000 mL | Freq: Once | INTRAVENOUS | Status: AC
  Administered 2021-03-13: 500 mL via INTRAVENOUS

## 2021-03-13 MED ORDER — SODIUM CHLORIDE 0.9 % IV BOLUS
1000.0000 mL | Freq: Once | INTRAVENOUS | Status: AC
  Administered 2021-03-13: 1000 mL via INTRAVENOUS

## 2021-03-13 NOTE — ED Triage Notes (Signed)
Patient presents from home due to weakness, lightheadedness and shaky. CBG 565, taken by EMS. Patient is taking prednisone. Took metformin (belongs to her husband) and glimepiride. EMS administered 350 ml of NS.  HX MS    EMS vitals: 60 HR 98% RA 16 RR 162/98 BP 97.5 Temp

## 2021-03-13 NOTE — ED Provider Notes (Addendum)
[facility]-EMERGENCY DEPT Provider Note   CSN: [id number] Arrival date & time: 03/13/21  1952     History Chief Complaint  Patient presents with   Hyperglycemia    Kathryn Gross is a 65 y.o. female.  HPI  65 year old female with past medical history of MS, DM on glimepiride, HTN presents emergency department concern for high blood sugar.  Patient states that she is currently in the middle of a prednisone taper for her multiple sclerosis.  She started feeling dry mouth and fatigue so she checked her blood sugar.  Blood sugars have been running in the 4-5 100s.  She tried to hydrate herself without any improvement.  Denies any recent fever, nausea/vomiting/diarrhea.  Past Medical History:  Diagnosis Date   Allergy    Anemia    past hx    Arthritis    right shoulder   Cataract    removed both eyes    Diabetes mellitus without complication (HCC)    on amaryl- pre DM per pt    GERD (gastroesophageal reflux disease)    Hypertension    Multiple sclerosis (HCC)    Neuromuscular disorder (HCC)    MS    Sleep apnea    uses cpap     There are no problems to display for this patient.   Past Surgical History:  Procedure Laterality Date   c sections     x3   CATARACT EXTRACTION, BILATERAL     COLONOSCOPY       OB History   No obstetric history on file.     Family History  Problem Relation Age of Onset   Colon cancer Neg Hx    Colon polyps Neg Hx    Esophageal cancer Neg Hx    Rectal cancer Neg Hx    Stomach cancer Neg Hx     Social History   Tobacco Use   Smoking status: Never   Smokeless tobacco: Never  Substance Use Topics   Alcohol use: No   Drug use: No    Home Medications Prior to Admission medications   Medication Sig Start Date End Date Taking? Authorizing Provider  ACCU-CHEK GUIDE test strip  11/04/19   [provider]  albuterol (PROVENTIL HFA;VENTOLIN HFA) 108 (90 Base) MCG/ACT inhaler Inhale 1-2 puffs into  the lungs every 6 (six) hours as needed for wheezing or shortness of breath. Patient not taking: Reported on 11/26/2019 05/31/18   Pricilla Loveless, MD  amphetamine-dextroamphetamine (ADDERALL) 20 MG tablet  05/06/19   [provider]  benzonatate (TESSALON) 100 MG capsule Take 200 mg by mouth 3 (three) times daily as needed. 07/25/19   [provider]  Blood Glucose Monitoring Suppl (GLUCOCOM BLOOD GLUCOSE MONITOR) DEVI FSBS DAILY 05/11/15   [provider]  Blood Glucose Monitoring Suppl (GLUCOCOM BLOOD GLUCOSE MONITOR) DEVI FSBS DAILY 05/11/15   [provider]  cephALEXin (KEFLEX) 500 MG capsule Take 1 capsule (500 mg total) by mouth 3 (three) times daily. Patient not taking: Reported on 11/26/2019 08/12/19   Ward, Layla Maw, DO  diphenoxylate-atropine (LOMOTIL) 2.5-0.025 MG per tablet Take 1 tablet by mouth 4 (four) times daily as needed for diarrhea or loose stools. Patient not taking: Reported on 11/26/2019 01/15/15   Pricilla Loveless, MD  famotidine (PEPCID) 20 MG tablet Take 20 mg by mouth 2 (two) times daily. 11/04/19   [provider]  fluticasone (FLONASE) 50 MCG/ACT nasal spray Place 1 spray into both nostrils daily as needed for allergies  or rhinitis.    [provider]  gabapentin (NEURONTIN) 300 MG capsule Take 300 mg by mouth 3 (three) times daily as needed (nerve pain).     [provider]  glimepiride (AMARYL) 1 MG tablet Take by mouth.    [provider]  Glucose Blood (COOL BLOOD GLUCOSE TEST STRIPS VI) USE TO CHECK BLOOD SUGAR DAILY 01/26/19   [provider]  guaiFENesin (ROBITUSSIN) 100 MG/5ML liquid Take 5-10 mLs (100-200 mg total) by mouth every 4 (four) hours as needed for cough. Patient not taking: Reported on 11/26/2019 05/31/18   Pricilla Loveless, MD  HYDROcodone-acetaminophen (NORCO/VICODIN) 5-325 MG tablet  10/10/16   [provider]  ibuprofen (ADVIL,MOTRIN) 800 MG tablet Take 800 mg by mouth  every 8 (eight) hours as needed for headache, mild pain, moderate pain or cramping.  06/08/14   [provider]  Interferon Beta-1a (REBIF [place]) Inject 44 mcg into the skin 3 (three) times a week.     [provider]  Lancets (ACCU-CHEK SOFT TOUCH) lancets by Does not apply route. 02/01/17   [provider]  lisinopril-hydrochlorothiazide (PRINZIDE,ZESTORETIC) 20-25 MG per tablet Take 1 tablet by mouth daily.    [provider]  loratadine-pseudoephedrine (CLARITIN-D 12-HOUR) 5-120 MG tablet Take by mouth as needed.  08/21/16   [provider]  magnesium 30 MG tablet Take by mouth.    [provider]  meclizine (ANTIVERT) 25 MG tablet Take by mouth 2 (two) times daily as needed.  01/16/16   [provider]  meloxicam (MOBIC) 7.5 MG tablet Take 7.5 mg by mouth 2 (two) times daily. 11/09/19   [provider]  montelukast (SINGULAIR) 10 MG tablet Take by mouth as needed.  07/08/15   [provider]  Multiple Vitamin (QUINTABS) TABS Take by mouth. Pre natal vitamin 05/26/18   [provider]  pantoprazole (PROTONIX) 40 MG tablet Take 40 mg by mouth daily. 10/09/19   [provider]  phenazopyridine (PYRIDIUM) 95 MG tablet Take 1 tablet (95 mg total) by mouth 3 (three) times daily as needed for pain. Patient not taking: Reported on 11/26/2019 08/12/19   Ward, Layla Maw, DO  polyethylene glycol (MIRALAX / GLYCOLAX) 17 g packet Take 17 g by mouth daily as needed for mild constipation. 09/28/20   Benjiman Core, MD  potassium chloride (K-DUR,KLOR-CON) 10 MEQ tablet Take 1 tablet by mouth daily. 04/14/14   [provider]  PREVIDENT 5000 BOOSTER PLUS 1.1 % PSTE  10/02/19   [provider]  promethazine (PHENERGAN) 25 MG tablet Take 25 mg by mouth every 8 (eight) hours as needed. 11/04/19   [provider]  ranitidine (ZANTAC) 150 MG tablet Take 150 mg by mouth 2 (two) times daily.    [provider]  temazepam (RESTORIL) 15 MG capsule Take 15 mg by mouth at bedtime. 01/05/14   [provider]  valACYclovir (VALTREX) 1000 MG tablet Take 2,000 mg by mouth 2 (two) times daily as needed (cold sores).  11/23/14   [provider]    Allergies    Patient has no known allergies.  Review of Systems   Review of Systems  Constitutional:  Positive for fatigue. Negative for chills and fever.  HENT:  Negative for congestion.   Eyes:  Negative for visual disturbance.  Respiratory:  Negative for shortness of breath.   Cardiovascular:  Negative for chest pain.  Gastrointestinal:  Negative for abdominal pain, diarrhea and vomiting.  Genitourinary:  Positive for  frequency. Negative for dysuria.  Skin:  Negative for rash.  Neurological:  Negative for headaches.   Physical Exam Updated Vital Signs BP (!) 178/69   Pulse (!) 53   Temp 98.2 F (36.8 C) (Oral)   Resp 13   Ht 5\' 4"  (1.626 m)   Wt 99.8 kg   SpO2 98%   BMI 37.76 kg/m   Physical Exam Vitals and nursing note reviewed.  Constitutional:      Appearance: Normal appearance.  HENT:     Head: Normocephalic.     Mouth/Throat:     Mouth: Mucous membranes are moist.  Cardiovascular:     Rate and Rhythm: Normal rate.  Pulmonary:     Effort: Pulmonary effort is normal. No respiratory distress.  Abdominal:     Palpations: Abdomen is soft.     Tenderness: There is no abdominal tenderness.  Skin:    General: Skin is warm.  Neurological:     Mental Status: She is alert and oriented to person, place, and time. Mental status is at baseline.  Psychiatric:        Mood and Affect: Mood normal.    ED Results / Procedures / Treatments   Labs (all labs ordered are listed, but only abnormal results are displayed) Labs Reviewed  CBG MONITORING, ED - Abnormal; Notable for the following components:      Result Value   Glucose-Capillary 512 (*)    All other components within normal limits  CBC WITH  DIFFERENTIAL/PLATELET  BASIC METABOLIC PANEL    EKG None  Radiology No results found.  Procedures Procedures   Medications Ordered in ED Medications  sodium chloride 0.9 % bolus 1,000 mL (has no administration in time range)    ED Course  I have reviewed the triage vital signs and the nursing notes.  Pertinent labs & imaging results that were available during my care of the patient were reviewed by me and considered in my medical decision making (see chart for details).    MDM Rules/Calculators/A&P                          65 year old diabetic female presents emergency department concern for hyperglycemia.  She is currently on a prednisone taper for MS.  On arrival her blood sugar is over 500, vitals are otherwise stable, no nausea/vomiting/diarrhea.  I believe this is most likely secondary to her steroid use.  Blood work shows mild dehydration with hyperglycemia and very mild hypokalemia. No DKA, HHS. Treated patient with IV hydration, SQ insulin and oral potassium.  Repeat blood sugar has now down trended to the low 300s.  Patient's normal is 150-200.  Educated the patient to stop the prednisone and continue oral hydration and low sugar diet over the next couple days.  I have educated the patient on strict return to ED instructions and she is requesting to go home to continue hydration.    Patient will be discharged and treated as an outpatient.  Discharge plan and strict return to ED precautions discussed, patient verbalizes understanding and agreement.  Final Clinical Impression(s) / ED Diagnoses Final diagnoses:  None    Rx / DC Orders ED Discharge Orders     None        70, DO 03/14/21 0005    Mumin Denomme, 05/15/21, DO 03/14/21 0005

## 2021-03-13 NOTE — Discharge Instructions (Addendum)
You have been seen and discharged from the emergency department.  Your blood sugar was found to be high.  You were given IV fluids and insulin here in the department.  Stay well-hydrated, follow a low sugar diet and take your home medications as prescribed.  Discontinue the prednisone, it may still take the next day/days for blood sugar to normalize.  Follow-up with your primary provider for reevaluation and further care. Take home medications as prescribed. If you have any worsening symptoms, or blood sugar spikes over 400 and stays consistent, or further concerns for your health please return to an emergency department for further evaluation.

## 2021-06-22 ENCOUNTER — Other Ambulatory Visit: Payer: Self-pay | Admitting: Psychiatry

## 2021-06-22 DIAGNOSIS — G35 Multiple sclerosis: Secondary | ICD-10-CM

## 2021-07-13 ENCOUNTER — Ambulatory Visit
Admission: RE | Admit: 2021-07-13 | Discharge: 2021-07-13 | Disposition: A | Discharge status: Home / Self Care | Payer: Medicare Other | Source: Ambulatory Visit | Attending: Psychiatry | Admitting: Psychiatry

## 2021-07-13 DIAGNOSIS — G35 Multiple sclerosis: Secondary | ICD-10-CM

## 2021-07-13 MED ORDER — GADOBENATE DIMEGLUMINE 529 MG/ML IV SOLN
20.0000 mL | Freq: Once | INTRAVENOUS | Status: AC | PRN
  Administered 2021-07-13: 20 mL via INTRAVENOUS

## 2021-09-11 ENCOUNTER — Other Ambulatory Visit: Payer: Self-pay

## 2021-09-11 ENCOUNTER — Encounter (HOSPITAL_BASED_OUTPATIENT_CLINIC_OR_DEPARTMENT_OTHER): Payer: Self-pay

## 2021-09-11 ENCOUNTER — Telehealth (HOSPITAL_BASED_OUTPATIENT_CLINIC_OR_DEPARTMENT_OTHER): Payer: Self-pay | Admitting: Emergency Medicine

## 2021-09-11 ENCOUNTER — Emergency Department (HOSPITAL_BASED_OUTPATIENT_CLINIC_OR_DEPARTMENT_OTHER)
Admission: EM | Admit: 2021-09-11 | Discharge: 2021-09-11 | Disposition: A | Discharge status: Home / Self Care | Payer: Medicare Other | Attending: Emergency Medicine | Admitting: Emergency Medicine

## 2021-09-11 DIAGNOSIS — U071 COVID-19: Secondary | ICD-10-CM | POA: Insufficient documentation

## 2021-09-11 DIAGNOSIS — R509 Fever, unspecified: Secondary | ICD-10-CM | POA: Diagnosis present

## 2021-09-11 LAB — RESP PANEL BY RT-PCR (FLU A&B, COVID) ARPGX2
Influenza A by PCR: NEGATIVE
Influenza B by PCR: NEGATIVE
SARS Coronavirus 2 by RT PCR: POSITIVE — AB

## 2021-09-11 MED ORDER — MOLNUPIRAVIR EUA 200MG CAPSULE: 4.0000 | ORAL_CAPSULE | Freq: Two times a day (BID) | ORAL | 0 refills | Status: AC

## 2021-09-11 MED ORDER — ACETAMINOPHEN 500 MG PO TABS
1000.0000 mg | ORAL_TABLET | Freq: Once | ORAL | Status: AC
  Administered 2021-09-11: 1000 mg via ORAL
  Filled 2021-09-11: qty 2

## 2021-09-11 NOTE — ED Provider Notes (Signed)
[facility] EMERGENCY DEPARTMENT Provider Note   CSN: [id number] Arrival date & time: 09/11/21  4098     History  Chief Complaint  Patient presents with   Cough    COVID +    GEORGANNA MAXSON is a 66 y.o. female.  Patient c/o non prod cough, body aches, fever, congestion, scratchy throat. Symptoms acute onset 2-3 days ago, moderate, constant, persistent. No trouble breathing or swallowing. Hx MS, no recent flare or new weakness/new neuro symptoms. No chest pain or discomfort. No abd pain or nvd. No severe headaches. No dysuria or gu c/o. No rash. No specific known ill contacts. Has been vaccinated for covid.   The history is provided by the patient and medical records.  Cough Associated symptoms: fever and myalgias   Associated symptoms: no chest pain, no headaches, no rash and no shortness of breath       Home Medications Prior to Admission medications   Medication Sig Start Date End Date Taking? Authorizing Provider  ACCU-CHEK GUIDE test strip  11/04/19   [provider]  albuterol (PROVENTIL HFA;VENTOLIN HFA) 108 (90 Base) MCG/ACT inhaler Inhale 1-2 puffs into the lungs every 6 (six) hours as needed for wheezing or shortness of breath. Patient not taking: Reported on 11/26/2019 05/31/18   Pricilla Loveless, MD  amphetamine-dextroamphetamine (ADDERALL) 20 MG tablet  05/06/19   [provider]  benzonatate (TESSALON) 100 MG capsule Take 200 mg by mouth 3 (three) times daily as needed. 07/25/19   [provider]  Blood Glucose Monitoring Suppl (GLUCOCOM BLOOD GLUCOSE MONITOR) DEVI FSBS DAILY 05/11/15   [provider]  Blood Glucose Monitoring Suppl (GLUCOCOM BLOOD GLUCOSE MONITOR) DEVI FSBS DAILY 05/11/15   [provider]  cephALEXin (KEFLEX) 500 MG capsule Take 1 capsule (500 mg total) by mouth 3 (three) times daily. Patient not taking: Reported on 11/26/2019 08/12/19   Ward, Layla Maw, DO  diphenoxylate-atropine (LOMOTIL)  2.5-0.025 MG per tablet Take 1 tablet by mouth 4 (four) times daily as needed for diarrhea or loose stools. Patient not taking: Reported on 11/26/2019 01/15/15   Pricilla Loveless, MD  famotidine (PEPCID) 20 MG tablet Take 20 mg by mouth 2 (two) times daily. 11/04/19   [provider]  fluticasone (FLONASE) 50 MCG/ACT nasal spray Place 1 spray into both nostrils daily as needed for allergies or rhinitis.    [provider]  gabapentin (NEURONTIN) 300 MG capsule Take 300 mg by mouth 3 (three) times daily as needed (nerve pain).     [provider]  glimepiride (AMARYL) 1 MG tablet Take by mouth.    [provider]  Glucose Blood (COOL BLOOD GLUCOSE TEST STRIPS VI) USE TO CHECK BLOOD SUGAR DAILY 01/26/19   [provider]  guaiFENesin (ROBITUSSIN) 100 MG/5ML liquid Take 5-10 mLs (100-200 mg total) by mouth every 4 (four) hours as needed for cough. Patient not taking: Reported on 11/26/2019 05/31/18   Pricilla Loveless, MD  HYDROcodone-acetaminophen (NORCO/VICODIN) 5-325 MG tablet  10/10/16   [provider]  ibuprofen (ADVIL,MOTRIN) 800 MG tablet Take 800 mg by mouth every 8 (eight) hours as needed for headache, mild pain, moderate pain or cramping.  06/08/14   [provider]  Interferon Beta-1a (REBIF [place]) Inject 44 mcg into the skin 3 (three) times a week.     [provider]  Lancets (ACCU-CHEK SOFT TOUCH) lancets by Does not apply route. 02/01/17   [provider]  lisinopril-hydrochlorothiazide (PRINZIDE,ZESTORETIC) 20-25 MG per tablet Take  1 tablet by mouth daily.    [provider]  loratadine-pseudoephedrine (CLARITIN-D 12-HOUR) 5-120 MG tablet Take by mouth as needed.  08/21/16   [provider]  magnesium 30 MG tablet Take by mouth.    [provider]  meclizine (ANTIVERT) 25 MG tablet Take by mouth 2 (two) times daily as needed.  01/16/16   [provider]  meloxicam (MOBIC) 7.5 MG tablet  Take 7.5 mg by mouth 2 (two) times daily. 11/09/19   [provider]  montelukast (SINGULAIR) 10 MG tablet Take by mouth as needed.  07/08/15   [provider]  Multiple Vitamin (QUINTABS) TABS Take by mouth. Pre natal vitamin 05/26/18   [provider]  pantoprazole (PROTONIX) 40 MG tablet Take 40 mg by mouth daily. 10/09/19   [provider]  phenazopyridine (PYRIDIUM) 95 MG tablet Take 1 tablet (95 mg total) by mouth 3 (three) times daily as needed for pain. Patient not taking: Reported on 11/26/2019 08/12/19   Ward, Layla Maw, DO  polyethylene glycol (MIRALAX / GLYCOLAX) 17 g packet Take 17 g by mouth daily as needed for mild constipation. 09/28/20   Benjiman Core, MD  potassium chloride (K-DUR,KLOR-CON) 10 MEQ tablet Take 1 tablet by mouth daily. 04/14/14   [provider]  PREVIDENT 5000 BOOSTER PLUS 1.1 % PSTE  10/02/19   [provider]  promethazine (PHENERGAN) 25 MG tablet Take 25 mg by mouth every 8 (eight) hours as needed. 11/04/19   [provider]  ranitidine (ZANTAC) 150 MG tablet Take 150 mg by mouth 2 (two) times daily.    [provider]  temazepam (RESTORIL) 15 MG capsule Take 15 mg by mouth at bedtime. 01/05/14   [provider]  valACYclovir (VALTREX) 1000 MG tablet Take 2,000 mg by mouth 2 (two) times daily as needed (cold sores).  11/23/14   [provider]      Allergies    Patient has no known allergies.    Review of Systems   Review of Systems  Constitutional:  Positive for fever.  HENT:  Positive for congestion. Negative for trouble swallowing.   Eyes:  Negative for redness.  Respiratory:  Positive for cough. Negative for shortness of breath.   Cardiovascular:  Negative for chest pain and leg swelling.  Gastrointestinal:  Negative for abdominal pain, diarrhea and vomiting.  Genitourinary:  Negative for dysuria and flank pain.  Musculoskeletal:  Positive for myalgias. Negative for  neck pain and neck stiffness.  Skin:  Negative for rash.  Neurological:  Negative for headaches.  Hematological:  Does not bruise/bleed easily.  Psychiatric/Behavioral:  Negative for confusion.    Physical Exam Updated Vital Signs BP 131/65    Pulse 99    Temp 100.1 F (37.8 C)    Resp 19    Ht 1.626 m (5\' 4" )    Wt 99.3 kg    SpO2 99%    BMI 37.59 kg/m  Physical Exam Vitals and nursing note reviewed.  Constitutional:      Appearance: Normal appearance. She is well-developed.  HENT:     Head: Atraumatic.     Nose: Congestion present.     Mouth/Throat:     Mouth: Mucous membranes are moist.     Pharynx: Oropharynx is clear. No oropharyngeal exudate or posterior oropharyngeal erythema.  Eyes:     General: No scleral icterus.    Conjunctiva/sclera: Conjunctivae normal.  Neck:     Trachea: No tracheal deviation.  Comments: No stiffness or rigidity.  Cardiovascular:     Rate and Rhythm: Normal rate and regular rhythm.     Pulses: Normal pulses.     Heart sounds: Normal heart sounds. No murmur heard.   No friction rub. No gallop.  Pulmonary:     Effort: Pulmonary effort is normal. No respiratory distress.     Breath sounds: Normal breath sounds.  Abdominal:     General: Bowel sounds are normal. There is no distension.     Palpations: Abdomen is soft.     Tenderness: There is no abdominal tenderness.  Genitourinary:    Comments: No cva tenderness.  Musculoskeletal:        General: No swelling or tenderness.     Cervical back: Normal range of motion and neck supple. No rigidity. No muscular tenderness.     Right lower leg: No edema.     Left lower leg: No edema.  Lymphadenopathy:     Cervical: No cervical adenopathy.  Skin:    General: Skin is warm and dry.     Findings: No rash.  Neurological:     Mental Status: She is alert.     Comments: Alert, speech normal.   Psychiatric:        Mood and Affect: Mood normal.    ED Results / Procedures / Treatments    Labs (all labs ordered are listed, but only abnormal results are displayed) Results for orders placed or performed during the hospital encounter of 09/11/21  Resp Panel by RT-PCR (Flu A&B, Covid) Nasopharyngeal Swab   Specimen: Nasopharyngeal Swab; Nasopharyngeal(NP) swabs in vial transport medium  Result Value Ref Range   SARS Coronavirus 2 by RT PCR POSITIVE (A) NEGATIVE   Influenza A by PCR NEGATIVE NEGATIVE   Influenza B by PCR NEGATIVE NEGATIVE      EKG None  Radiology No results found.  Procedures Procedures    Medications Ordered in ED Medications  acetaminophen (TYLENOL) tablet 1,000 mg (1,000 mg Oral Given 09/11/21 1244)    ED Course/ Medical Decision Making/ A&P                           Medical Decision Making  Stat labs.   Reviewed nursing notes and prior charts for additional history.  Outside records reviewed.   Labs reviewed/interpreted by me - covid positive.   ANNLOUISE GERETY was evaluated in Emergency Department on 09/11/2021 for the symptoms described in the history of present illness. She was evaluated in the context of the global COVID-19 pandemic, which necessitated consideration that the patient might be at risk for infection with the SARS-CoV-2 virus that causes COVID-19. Institutional protocols and algorithms that pertain to the evaluation of patients at risk for COVID-19 are in a state of rapid change based on information released by regulatory bodies including the CDC and federal and state organizations. These policies and algorithms were followed during the patient's care in the ED.  Acetaminophen po. Po fluids.  Pt is breathing comfortably, currently hr 94, rr 16, pulse ox 99% room air.   Pt is vaccinated. Does have some risk factors for covid.   RX molnupiravir offered - pt agreeable.   Rx sent.   Return precautions provided.           Final Clinical Impression(s) / ED Diagnoses Final diagnoses:  None    Rx / DC  Orders ED Discharge Orders     None  Cathren Laine, MD 09/11/21 1258

## 2021-09-11 NOTE — Discharge Instructions (Addendum)
It was our pleasure to provide your ER care today - we hope that you feel better.  Your covid test is positive - see attached info.  Drink plenty of fluids/stay well hydrated. Stay active, take full and deep breaths. Take acetaminophen or ibuprofen as need for fever or body aches. Take molnupiravir as prescribed.   Follow up with primary care doctor in two weeks if symptoms fail to improve/resolve.  Return to ER if worse, new symptoms, increased trouble breathing, or other emergency concern.

## 2021-09-11 NOTE — ED Triage Notes (Addendum)
Saturday started having body aches & sore throat, cough, congestion, generalized weakness. Hx of MS. Fever of 102.23f tmax at home. Positive home covid test.

## 2021-09-11 NOTE — ED Notes (Signed)
Pt ambulated to room 11 on pulse ox.  Saturation 96-98%.  HR 120-124.  Pt SOB on exertion

## 2021-09-29 ENCOUNTER — Encounter (HOSPITAL_COMMUNITY): Payer: Self-pay | Admitting: Radiology

## 2023-02-27 ENCOUNTER — Other Ambulatory Visit: Payer: Self-pay

## 2023-02-27 DIAGNOSIS — G35 Multiple sclerosis: Secondary | ICD-10-CM

## 2023-02-28 ENCOUNTER — Other Ambulatory Visit: Payer: Self-pay | Admitting: *Deleted

## 2023-02-28 DIAGNOSIS — G35 Multiple sclerosis: Secondary | ICD-10-CM

## 2023-03-05 ENCOUNTER — Other Ambulatory Visit: Payer: Self-pay

## 2023-03-05 ENCOUNTER — Emergency Department (HOSPITAL_BASED_OUTPATIENT_CLINIC_OR_DEPARTMENT_OTHER): Payer: 59

## 2023-03-05 ENCOUNTER — Emergency Department (HOSPITAL_BASED_OUTPATIENT_CLINIC_OR_DEPARTMENT_OTHER)
Admission: EM | Admit: 2023-03-05 | Discharge: 2023-03-06 | Disposition: A | Discharge status: Home / Self Care | Payer: 59 | Attending: Emergency Medicine | Admitting: Emergency Medicine

## 2023-03-05 DIAGNOSIS — N83202 Unspecified ovarian cyst, left side: Secondary | ICD-10-CM | POA: Insufficient documentation

## 2023-03-05 DIAGNOSIS — I10 Essential (primary) hypertension: Secondary | ICD-10-CM | POA: Insufficient documentation

## 2023-03-05 DIAGNOSIS — Z7984 Long term (current) use of oral hypoglycemic drugs: Secondary | ICD-10-CM | POA: Insufficient documentation

## 2023-03-05 DIAGNOSIS — N8500 Endometrial hyperplasia, unspecified: Secondary | ICD-10-CM | POA: Insufficient documentation

## 2023-03-05 DIAGNOSIS — Z79899 Other long term (current) drug therapy: Secondary | ICD-10-CM | POA: Diagnosis not present

## 2023-03-05 DIAGNOSIS — E119 Type 2 diabetes mellitus without complications: Secondary | ICD-10-CM | POA: Diagnosis not present

## 2023-03-05 DIAGNOSIS — D259 Leiomyoma of uterus, unspecified: Secondary | ICD-10-CM | POA: Insufficient documentation

## 2023-03-05 DIAGNOSIS — E876 Hypokalemia: Secondary | ICD-10-CM | POA: Insufficient documentation

## 2023-03-05 DIAGNOSIS — R1031 Right lower quadrant pain: Secondary | ICD-10-CM | POA: Diagnosis present

## 2023-03-05 DIAGNOSIS — R9389 Abnormal findings on diagnostic imaging of other specified body structures: Secondary | ICD-10-CM

## 2023-03-05 LAB — CBC
HCT: 30.3 % — ABNORMAL LOW (ref 36.0–46.0)
Hemoglobin: 9.7 g/dL — ABNORMAL LOW (ref 12.0–15.0)
MCH: 29.1 pg (ref 26.0–34.0)
MCHC: 32 g/dL (ref 30.0–36.0)
MCV: 91 fL (ref 80.0–100.0)
Platelets: 321 10*3/uL (ref 150–400)
RBC: 3.33 MIL/uL — ABNORMAL LOW (ref 3.87–5.11)
RDW: 14.3 % (ref 11.5–15.5)
WBC: 11.6 10*3/uL — ABNORMAL HIGH (ref 4.0–10.5)
nRBC: 0 % (ref 0.0–0.2)

## 2023-03-05 LAB — COMPREHENSIVE METABOLIC PANEL
ALT: 27 U/L (ref 0–44)
AST: 23 U/L (ref 15–41)
Albumin: 3.8 g/dL (ref 3.5–5.0)
Alkaline Phosphatase: 91 U/L (ref 38–126)
Anion gap: 9 (ref 5–15)
BUN: 25 mg/dL — ABNORMAL HIGH (ref 8–23)
CO2: 23 mmol/L (ref 22–32)
Calcium: 9.7 mg/dL (ref 8.9–10.3)
Chloride: 107 mmol/L (ref 98–111)
Creatinine, Ser: 1.13 mg/dL — ABNORMAL HIGH (ref 0.44–1.00)
GFR, Estimated: 54 mL/min — ABNORMAL LOW (ref >60)
Glucose, Bld: 145 mg/dL — ABNORMAL HIGH (ref 70–99)
Potassium: 3.2 mmol/L — ABNORMAL LOW (ref 3.5–5.1)
Sodium: 139 mmol/L (ref 135–145)
Total Bilirubin: 0.5 mg/dL (ref 0.3–1.2)
Total Protein: 7.7 g/dL (ref 6.5–8.1)

## 2023-03-05 LAB — URINALYSIS, ROUTINE W REFLEX MICROSCOPIC
Bilirubin Urine: NEGATIVE
Glucose, UA: NEGATIVE mg/dL
Hgb urine dipstick: NEGATIVE
Ketones, ur: NEGATIVE mg/dL
Nitrite: NEGATIVE
Protein, ur: 30 mg/dL — AB
Specific Gravity, Urine: >=1.03 (ref 1.005–1.030)
pH: 5.5 (ref 5.0–8.0)

## 2023-03-05 LAB — LIPASE, BLOOD: Lipase: 39 U/L (ref 11–51)

## 2023-03-05 LAB — URINALYSIS, MICROSCOPIC (REFLEX)

## 2023-03-05 MED ORDER — MORPHINE SULFATE (PF) 4 MG/ML IV SOLN
4.0000 mg | Freq: Once | INTRAVENOUS | Status: AC
  Administered 2023-03-05: 4 mg via INTRAVENOUS
  Filled 2023-03-05: qty 1

## 2023-03-05 MED ORDER — IOHEXOL 300 MG/ML  SOLN
100.0000 mL | Freq: Once | INTRAMUSCULAR | Status: AC | PRN
  Administered 2023-03-05: 100 mL via INTRAVENOUS

## 2023-03-05 MED ORDER — KETOROLAC TROMETHAMINE 15 MG/ML IJ SOLN
15.0000 mg | Freq: Once | INTRAMUSCULAR | Status: AC
  Administered 2023-03-05: 15 mg via INTRAVENOUS
  Filled 2023-03-05: qty 1

## 2023-03-05 MED ORDER — ONDANSETRON HCL 4 MG/2ML IJ SOLN
4.0000 mg | Freq: Once | INTRAMUSCULAR | Status: AC
  Administered 2023-03-05: 4 mg via INTRAVENOUS
  Filled 2023-03-05: qty 2

## 2023-03-05 MED ORDER — POTASSIUM CHLORIDE CRYS ER 20 MEQ PO TBCR
40.0000 meq | EXTENDED_RELEASE_TABLET | Freq: Once | ORAL | Status: AC
  Administered 2023-03-05: 40 meq via ORAL
  Filled 2023-03-05: qty 2

## 2023-03-05 NOTE — ED Provider Notes (Signed)
[place] [facility] Provider Note   CSN: [id number] Arrival date & time: 03/05/23  1824     History  Chief Complaint  Patient presents with   Abdominal Pain    GIAHNA DINA is a 67 y.o. female with past medical history diabetes, GERD, hypertension, MS who presents to the ED complaining of right-sided abdominal pain radiating into the pelvis.  States that this started 3 days ago but became acutely worse today.  Made her PCP aware earlier this morning was scheduled for outpatient imaging but pain became severe so she came to the ED for further evaluation.  Previous abdominal surgeries include right oopherectomy.  She denies vaginal bleeding or discharge, fever, dysuria or hematuria, chest pain or dyspnea, or back pain. Notes taking home pain medication without relief. No nausea, vomiting, or diarrhea. No history of pain like this.      Home Medications Prior to Admission medications   Medication Sig Start Date End Date Taking? Authorizing Provider  ACCU-CHEK GUIDE test strip  11/04/19   [provider]  albuterol (PROVENTIL HFA;VENTOLIN HFA) 108 (90 Base) MCG/ACT inhaler Inhale 1-2 puffs into the lungs every 6 (six) hours as needed for wheezing or shortness of breath. Patient not taking: Reported on 11/26/2019 05/31/18   Pricilla Loveless, MD  amphetamine-dextroamphetamine (ADDERALL) 20 MG tablet  05/06/19   [provider]  benzonatate (TESSALON) 100 MG capsule Take 200 mg by mouth 3 (three) times daily as needed. 07/25/19   [provider]  Blood Glucose Monitoring Suppl (GLUCOCOM BLOOD GLUCOSE MONITOR) DEVI FSBS DAILY 05/11/15   [provider]  Blood Glucose Monitoring Suppl (GLUCOCOM BLOOD GLUCOSE MONITOR) DEVI FSBS DAILY 05/11/15   [provider]  cephALEXin (KEFLEX) 500 MG capsule Take 1 capsule (500 mg total) by mouth 3 (three) times daily. Patient not taking: Reported on 11/26/2019 08/12/19   Ward,  Layla Maw, DO  diphenoxylate-atropine (LOMOTIL) 2.5-0.025 MG per tablet Take 1 tablet by mouth 4 (four) times daily as needed for diarrhea or loose stools. Patient not taking: Reported on 11/26/2019 01/15/15   Pricilla Loveless, MD  famotidine (PEPCID) 20 MG tablet Take 20 mg by mouth 2 (two) times daily. 11/04/19   [provider]  fluticasone (FLONASE) 50 MCG/ACT nasal spray Place 1 spray into both nostrils daily as needed for allergies or rhinitis.    [provider]  gabapentin (NEURONTIN) 300 MG capsule Take 300 mg by mouth 3 (three) times daily as needed (nerve pain).     [provider]  glimepiride (AMARYL) 1 MG tablet Take by mouth.    [provider]  Glucose Blood (COOL BLOOD GLUCOSE TEST STRIPS VI) USE TO CHECK BLOOD SUGAR DAILY 01/26/19   [provider]  guaiFENesin (ROBITUSSIN) 100 MG/5ML liquid Take 5-10 mLs (100-200 mg total) by mouth every 4 (four) hours as needed for cough. Patient not taking: Reported on 11/26/2019 05/31/18   Pricilla Loveless, MD  HYDROcodone-acetaminophen (NORCO/VICODIN) 5-325 MG tablet  10/10/16   [provider]  ibuprofen (ADVIL,MOTRIN) 800 MG tablet Take 800 mg by mouth every 8 (eight) hours as needed for headache, mild pain, moderate pain or cramping.  06/08/14   [provider]  Interferon Beta-1a (REBIF [place]) Inject 44 mcg into the skin 3 (three) times a week.     [provider]  Lancets (ACCU-CHEK SOFT TOUCH) lancets by Does not apply route. 02/01/17   [provider]  lisinopril-hydrochlorothiazide (PRINZIDE,ZESTORETIC) 20-25 MG per tablet  Take 1 tablet by mouth daily.    [provider]  loratadine-pseudoephedrine (CLARITIN-D 12-HOUR) 5-120 MG tablet Take by mouth as needed.  08/21/16   [provider]  magnesium 30 MG tablet Take by mouth.    [provider]  meclizine (ANTIVERT) 25 MG tablet Take by mouth 2 (two) times daily as needed.  01/16/16   [provider]  meloxicam (MOBIC) 7.5 MG tablet Take 7.5 mg by mouth 2 (two) times daily. 11/09/19   [provider]  montelukast (SINGULAIR) 10 MG tablet Take by mouth as needed.  07/08/15   [provider]  Multiple Vitamin (QUINTABS) TABS Take by mouth. Pre natal vitamin 05/26/18   [provider]  pantoprazole (PROTONIX) 40 MG tablet Take 40 mg by mouth daily. 10/09/19   [provider]  phenazopyridine (PYRIDIUM) 95 MG tablet Take 1 tablet (95 mg total) by mouth 3 (three) times daily as needed for pain. Patient not taking: Reported on 11/26/2019 08/12/19   Ward, Layla Maw, DO  polyethylene glycol (MIRALAX / GLYCOLAX) 17 g packet Take 17 g by mouth daily as needed for mild constipation. 09/28/20   Benjiman Core, MD  potassium chloride (K-DUR,KLOR-CON) 10 MEQ tablet Take 1 tablet by mouth daily. 04/14/14   [provider]  PREVIDENT 5000 BOOSTER PLUS 1.1 % PSTE  10/02/19   [provider]  promethazine (PHENERGAN) 25 MG tablet Take 25 mg by mouth every 8 (eight) hours as needed. 11/04/19   [provider]  ranitidine (ZANTAC) 150 MG tablet Take 150 mg by mouth 2 (two) times daily.    [provider]  temazepam (RESTORIL) 15 MG capsule Take 15 mg by mouth at bedtime. 01/05/14   [provider]  valACYclovir (VALTREX) 1000 MG tablet Take 2,000 mg by mouth 2 (two) times daily as needed (cold sores).  11/23/14   [provider]      Allergies    Patient has no known allergies.    Review of Systems   Review of Systems  All other systems reviewed and are negative.   Physical Exam Updated Vital Signs BP (!) 140/71   Pulse 75   Temp 98.1 F (36.7 C) (Oral)   Resp 18   Ht 5\' 4"  (1.626 m)   Wt 99.8 kg   SpO2 100%   BMI 37.76 kg/m  Physical Exam Vitals and nursing note reviewed.  Constitutional:      General: She is in acute distress (mild secondary to pain).     Appearance: Normal appearance. She is not  ill-appearing or toxic-appearing.  HENT:     Head: Normocephalic and atraumatic.     Mouth/Throat:     Mouth: Mucous membranes are moist.  Eyes:     Conjunctiva/sclera: Conjunctivae normal.  Cardiovascular:     Rate and Rhythm: Normal rate and regular rhythm.     Heart sounds: No murmur heard. Pulmonary:     Effort: Pulmonary effort is normal.     Breath sounds: Normal breath sounds.  Abdominal:     General: Abdomen is flat.     Palpations: Abdomen is soft.     Tenderness: There is abdominal tenderness in the right lower quadrant. There is no right CVA tenderness, left CVA tenderness, guarding or rebound. Negative signs include Murphy's sign, Rovsing's sign and McBurney's sign.  Musculoskeletal:        General: Normal range of motion.     Cervical back: Neck supple.  Right lower leg: No edema.     Left lower leg: No edema.  Skin:    General: Skin is warm and dry.     Capillary Refill: Capillary refill takes less than 2 seconds.  Neurological:     Mental Status: She is alert. Mental status is at baseline.  Psychiatric:        Behavior: Behavior normal.     ED Results / Procedures / Treatments   Labs (all labs ordered are listed, but only abnormal results are displayed) Labs Reviewed  COMPREHENSIVE METABOLIC PANEL - Abnormal; Notable for the following components:      Result Value   Potassium 3.2 (*)    Glucose, Bld 145 (*)    BUN 25 (*)    Creatinine, Ser 1.13 (*)    GFR, Estimated 54 (*)    All other components within normal limits  CBC - Abnormal; Notable for the following components:   WBC 11.6 (*)    RBC 3.33 (*)    Hemoglobin 9.7 (*)    HCT 30.3 (*)    All other components within normal limits  URINALYSIS, ROUTINE W REFLEX MICROSCOPIC - Abnormal; Notable for the following components:   APPearance HAZY (*)    Protein, ur 30 (*)    Leukocytes,Ua SMALL (*)    All other components within normal limits  URINALYSIS, MICROSCOPIC (REFLEX) - Abnormal; Notable for  the following components:   Bacteria, UA FEW (*)    All other components within normal limits  LIPASE, BLOOD    EKG None  Radiology US PELVIC COMPLETE WITH TRANSVAGINAL  Result Date: 03/05/2023 CLINICAL DATA:  67 year old with right lower quadrant pain. EXAM: TRANSABDOMINAL AND TRANSVAGINAL ULTRASOUND OF PELVIS TECHNIQUE: Both transabdominal and transvaginal ultrasound examinations of the pelvis were performed. Transabdominal technique was performed for global imaging of the pelvis including uterus, ovaries, adnexal regions, and pelvic cul-de-sac. It was necessary to proceed with endovaginal exam following the transabdominal exam to visualize the uterus, ovaries and adnexa. COMPARISON:  CT earlier today FINDINGS: Uterus Measurements: 9.1 x 4.4 x 6 cm = volume: 127 mL. The uterus is anteverted. Multiple fibrosis seen on CT. Fundal fibroid anteriorly measures 2.2 x 1.1 x 2.6 cm and is calcified. Second calcified fibroid posteriorly measures 2.1 x 0.9 x 2.4 cm. Endometrium Thickness: 6-7 mm. Heterogeneous with small cysts and cystic spaces. Right ovary Not visualized.  No adnexal mass. Left ovary Measurements: 6 x 3.7 x 2 cm = volume: 23 mL. There is a simple cyst measuring 1.5 cm, adjacent simple cyst measures 5.9 cm. It is unclear if this is ovarian or paraovarian. Ovarian blood flow is demonstrated to the parenchyma. Other findings No abnormal free fluid. IMPRESSION: 1. Uterine fibroids. 2. Upper normal in thickness endometrium for age at 6-7 mm, heterogeneous with small cyst and cystic spaces. This may represent adenomyosis, but is nonspecific. Patient reports no vaginal bleeding. Recommend follow-up with gyn. 3. Right ovary is not seen, no right adnexal mass. 4. Left ovarian cyst measures 1.5 cm. Adjacent simple cyst measuring 5.9 cm, unclear if it is ovarian or paraovarian. For a cyst of this size in a postmenopausal patient, consensus guidelines recommend yearly sonographic follow-up. Electronically  Signed   By: Narda Rutherford M.D.   On: 03/05/2023 23:10   CT ABDOMEN PELVIS W CONTRAST  Result Date: 03/05/2023 CLINICAL DATA:  Right lower quadrant pain. EXAM: CT ABDOMEN AND PELVIS WITH CONTRAST TECHNIQUE: Multidetector CT imaging of the abdomen and pelvis was performed using the  standard protocol following bolus administration of intravenous contrast. RADIATION DOSE REDUCTION: This exam was performed according to the departmental dose-optimization program which includes automated exposure control, adjustment of the mA and/or kV according to patient size and/or use of iterative reconstruction technique. CONTRAST:  OMNIPAQUE IOHEXOL 300 MG/ML  SOLN COMPARISON:  September 28, 2020 FINDINGS: Lower chest: No acute abnormality. Hepatobiliary: No focal liver abnormality is seen. No gallstones, gallbladder wall thickening, or biliary dilatation. Pancreas: Unremarkable. No pancreatic ductal dilatation or surrounding inflammatory changes. Spleen: Normal in size without focal abnormality. Adrenals/Urinary Tract: Adrenal glands are unremarkable. Kidneys are normal in size, without renal calculi or hydronephrosis. A 2.9 cm simple cyst is seen within the anterior aspect of the mid right kidney. The urinary bladder is poorly distended and subsequently limited in evaluation. Stomach/Bowel: Stomach is within normal limits. The appendix is not clearly identified no evidence of bowel wall thickening, distention, or inflammatory changes. Noninflamed diverticula are seen throughout the sigmoid colon. Vascular/Lymphatic: Aortic atherosclerosis. No enlarged abdominal or pelvic lymph nodes. Reproductive: Calcified uterine fibroids are seen with the uterine fundus. The right adnexa is unremarkable. A predominantly stable 5.7 cm x 4.0 cm simple cyst is seen within the left adnexa. Other: No abdominal wall hernia or abnormality. No abdominopelvic ascites. Musculoskeletal: No acute or significant osseous findings. IMPRESSION: 1.  Sigmoid diverticulosis. 2. Calcified uterine fibroids. 3. Stable simple left adnexal cyst. No follow-up imaging is recommended. This recommendation follows ACR consensus guidelines: White Paper of the ACR Incidental Findings Committee II on Adnexal Findings. J Am Coll Radiol 2013:10:675-681. 4. Aortic atherosclerosis. Aortic Atherosclerosis (ICD10-I70.0). Electronically Signed   By: Aram Candela M.D.   On: 03/05/2023 22:25    Procedures Procedures    Medications Ordered in ED Medications  potassium chloride SA (KLOR-CON M) CR tablet 40 mEq (has no administration in time range)  morphine (PF) 4 MG/ML injection 4 mg (has no administration in time range)  morphine (PF) 4 MG/ML injection 4 mg (4 mg Intravenous Given 03/05/23 2131)  ketorolac (TORADOL) 15 MG/ML injection 15 mg (15 mg Intravenous Given 03/05/23 2132)  ondansetron (ZOFRAN) injection 4 mg (4 mg Intravenous Given 03/05/23 2132)  iohexol (OMNIPAQUE) 300 MG/ML solution 100 mL (100 mLs Intravenous Contrast Given 03/05/23 2144)    ED Course/ Medical Decision Making/ A&P                             Medical Decision Making Amount and/or Complexity of Data Reviewed Labs: ordered. Decision-making details documented in ED Course. Radiology: ordered. Decision-making details documented in ED Course.  Risk Prescription drug management.   Medical Decision Making:   WILHELMENA MARZULLO is a 67 y.o. female who presented to the ED today with abdominal pain detailed above.    Additional history discussed with patient's family/caregivers.  Patient's presentation is complicated by their history of multiple comorbidities.  Complete initial physical exam performed, notably the patient  was in mild distress secondary to pain.  History of right lower quadrant abdominal tenderness but abdomen soft, nondistended.  No.  Signs.  No CVA tenderness.  Patient nontoxic appearing.    Reviewed and confirmed nursing documentation for past medical history,  family history, social history.    Initial Assessment:   With the patient's presentation of abdominal pain, differential diagnosis includes but is not limited to AAA, mesenteric ischemia, appendicitis, diverticulitis, DKA, gastritis, gastroenteritis, AMI, nephrolithiasis, pancreatitis, peritonitis, adrenal insufficiency, intestinal ischemia, constipation, UTI, SBO/LBO, splenic rupture,  biliary disease, IBD, IBS, PUD, hepatitis, STD, ovarian/testicular torsion, electrolyte disturbance, DKA, dehydration, acute kidney injury, renal failure, cholecystitis, cholelithiasis, choledocholithiasis, abdominal pain of  unknown etiology.   Initial Plan:  Screening labs including CBC and Metabolic panel to evaluate for infectious or metabolic etiology of disease.  Lipase to evaluate for pancreatitis Urinalysis with reflex culture ordered to evaluate for UTI or relevant urologic/nephrologic pathology.  CT abd/pelvis to evaluate for intra-abdominal pathology [place] to evaluate for intra-abdominal pathology Symptomatic management Objective evaluation as reviewed   Initial Study Results:   Laboratory  All laboratory results reviewed without evidence of clinically relevant pathology.   Exceptions include: Potassium 3.2, creatinine 1.13 similar to baseline, WBC 11.6, hemoglobin 9.7 similar to baseline, UA positive for squamous epithelial cell contamination  Radiology:  All images reviewed independently. Agree with radiology report at this time.   US PELVIC COMPLETE WITH TRANSVAGINAL  Result Date: 03/05/2023 CLINICAL DATA:  67 year old with right lower quadrant pain. EXAM: TRANSABDOMINAL AND TRANSVAGINAL ULTRASOUND OF PELVIS TECHNIQUE: Both transabdominal and transvaginal ultrasound examinations of the pelvis were performed. Transabdominal technique was performed for global imaging of the pelvis including uterus, ovaries, adnexal regions, and pelvic cul-de-sac. It was necessary to proceed with endovaginal exam  following the transabdominal exam to visualize the uterus, ovaries and adnexa. COMPARISON:  CT earlier today FINDINGS: Uterus Measurements: 9.1 x 4.4 x 6 cm = volume: 127 mL. The uterus is anteverted. Multiple fibrosis seen on CT. Fundal fibroid anteriorly measures 2.2 x 1.1 x 2.6 cm and is calcified. Second calcified fibroid posteriorly measures 2.1 x 0.9 x 2.4 cm. Endometrium Thickness: 6-7 mm. Heterogeneous with small cysts and cystic spaces. Right ovary Not visualized.  No adnexal mass. Left ovary Measurements: 6 x 3.7 x 2 cm = volume: 23 mL. There is a simple cyst measuring 1.5 cm, adjacent simple cyst measures 5.9 cm. It is unclear if this is ovarian or paraovarian. Ovarian blood flow is demonstrated to the parenchyma. Other findings No abnormal free fluid. IMPRESSION: 1. Uterine fibroids. 2. Upper normal in thickness endometrium for age at 6-7 mm, heterogeneous with small cyst and cystic spaces. This may represent adenomyosis, but is nonspecific. Patient reports no vaginal bleeding. Recommend follow-up with gyn. 3. Right ovary is not seen, no right adnexal mass. 4. Left ovarian cyst measures 1.5 cm. Adjacent simple cyst measuring 5.9 cm, unclear if it is ovarian or paraovarian. For a cyst of this size in a postmenopausal patient, consensus guidelines recommend yearly sonographic follow-up. Electronically Signed   By: Narda Rutherford M.D.   On: 03/05/2023 23:10   CT ABDOMEN PELVIS W CONTRAST  Result Date: 03/05/2023 CLINICAL DATA:  Right lower quadrant pain. EXAM: CT ABDOMEN AND PELVIS WITH CONTRAST TECHNIQUE: Multidetector CT imaging of the abdomen and pelvis was performed using the standard protocol following bolus administration of intravenous contrast. RADIATION DOSE REDUCTION: This exam was performed according to the departmental dose-optimization program which includes automated exposure control, adjustment of the mA and/or kV according to patient size and/or use of iterative reconstruction  technique. CONTRAST:  OMNIPAQUE IOHEXOL 300 MG/ML  SOLN COMPARISON:  September 28, 2020 FINDINGS: Lower chest: No acute abnormality. Hepatobiliary: No focal liver abnormality is seen. No gallstones, gallbladder wall thickening, or biliary dilatation. Pancreas: Unremarkable. No pancreatic ductal dilatation or surrounding inflammatory changes. Spleen: Normal in size without focal abnormality. Adrenals/Urinary Tract: Adrenal glands are unremarkable. Kidneys are normal in size, without renal calculi or hydronephrosis. A 2.9 cm simple cyst is seen within the anterior aspect  of the mid right kidney. The urinary bladder is poorly distended and subsequently limited in evaluation. Stomach/Bowel: Stomach is within normal limits. The appendix is not clearly identified no evidence of bowel wall thickening, distention, or inflammatory changes. Noninflamed diverticula are seen throughout the sigmoid colon. Vascular/Lymphatic: Aortic atherosclerosis. No enlarged abdominal or pelvic lymph nodes. Reproductive: Calcified uterine fibroids are seen with the uterine fundus. The right adnexa is unremarkable. A predominantly stable 5.7 cm x 4.0 cm simple cyst is seen within the left adnexa. Other: No abdominal wall hernia or abnormality. No abdominopelvic ascites. Musculoskeletal: No acute or significant osseous findings. IMPRESSION: 1. Sigmoid diverticulosis. 2. Calcified uterine fibroids. 3. Stable simple left adnexal cyst. No follow-up imaging is recommended. This recommendation follows ACR consensus guidelines: White Paper of the ACR Incidental Findings Committee II on Adnexal Findings. J Am Coll Radiol 2013:10:675-681. 4. Aortic atherosclerosis. Aortic Atherosclerosis (ICD10-I70.0). Electronically Signed   By: Aram Candela M.D.   On: 03/05/2023 22:25      Final Assessment and Plan:   67 year old female presents to the ED complaining of right-sided abdominal pain.  No associated volume losses.  Patient afebrile.  She does  have right lower quadrant abdominal tenderness but abdomen soft, nondistended.  No CVA tenderness.  Patient nontoxic-appearing but does appear to be uncomfortable.  States that pain does radiate down into the pelvis.  No vaginal bleeding or discharge.  Workup initiated as above for further assessment.  Creatinine baseline.  Hemoglobin baseline.  Minimal hypokalemia which was repleted.  No other significant electrolyte disturbance.  Minimal leukocytosis.  CT obtained for further characterization.  Doubt acute, emergent finding.  Performed ultrasound for better characterization of pelvic pain.  This showed uterine leiomyomas, left ovarian cyst, and mild endometrial thickening.  Reports that this could be contributing to patient's pain.  Discussed in detail with patient and family at bedside.  Given level of patient's pain today, she is requesting to be referred to OB/GYN which I believe is reasonable given her presentation.  She is interested in hysterectomy which I do believe could be beneficial to her.  She has pain medication at home that she uses to treat her illness.  Encourage close follow-up with OB/GYN and PCP for further evaluation of any continued symptoms.  Patient and family was given strict ED return precautions, all questions answered, and stable for discharge.   Clinical Impression:  1. Uterine leiomyoma, unspecified location   2. Endometrial thickening on ultrasound   3. Left ovarian cyst   4. Hypokalemia      Discharge           Final Clinical Impression(s) / ED Diagnoses Final diagnoses:  Uterine leiomyoma, unspecified location  Endometrial thickening on ultrasound  Left ovarian cyst  Hypokalemia    Rx / DC Orders ED Discharge Orders          Ordered    Ambulatory referral to Obstetrics / Gynecology        03/05/23 2330              Tonette Lederer, PA-C 03/05/23 2356    Franne Forts, DO 03/13/23 1613

## 2023-03-05 NOTE — Discharge Instructions (Signed)
Thank you for letting us take care of you today.  We did not find any emergent findings on your imaging that we need to admit you for the hospital for. However, you do have some fibroids, an ovarian cyst, and a slightly thickened layer of your uterus that is likely contributing to your symptoms. I have referred you to OB/GYN for follow up and discussion of further care and possible hysterectomy. At a minimum, you should have a repeat ultrasound in the future to be sure these conditions are not worsening.  Follow up closely with your PCP this week for reevaluation and discussion of any continued symptoms.  You should hear from OB/GYN within 72 hours to set up an appointment.  If you do not hear from them, call the clinic to schedule this appointment.  For any new or worsening condition, return to the nearest ED for reevaluation.

## 2023-03-05 NOTE — ED Triage Notes (Signed)
Patient presents to ED via [facility] from home. Here with RLQ pain that radiates into her pelvis. Seen at PCP today. Reports pain has gotten progressively worse.

## 2023-04-12 ENCOUNTER — Ambulatory Visit
Admission: RE | Admit: 2023-04-12 | Discharge: 2023-04-12 | Disposition: A | Discharge status: Home / Self Care | Payer: 59 | Source: Ambulatory Visit | Attending: *Deleted | Admitting: *Deleted

## 2023-04-12 DIAGNOSIS — G35 Multiple sclerosis: Secondary | ICD-10-CM

## 2023-04-12 MED ORDER — GADOPICLENOL 0.5 MMOL/ML IV SOLN
10.0000 mL | Freq: Once | INTRAVENOUS | Status: AC | PRN
  Administered 2023-04-12: 10 mL via INTRAVENOUS
